# Patient Record
Sex: Male | Born: 1935 | Race: White | Hispanic: No | Smoking: Never smoker
Health system: Southern US, Community
[De-identification: ages and names within clinical notes are randomized; demographics above are authoritative.]

## PROBLEM LIST (undated history)

## (undated) DIAGNOSIS — N3941 Urge incontinence: Secondary | ICD-10-CM

## (undated) DIAGNOSIS — I10 Essential (primary) hypertension: Secondary | ICD-10-CM

## (undated) DIAGNOSIS — M5430 Sciatica, unspecified side: Secondary | ICD-10-CM

## (undated) DIAGNOSIS — F0390 Unspecified dementia without behavioral disturbance: Secondary | ICD-10-CM

## (undated) DIAGNOSIS — N400 Enlarged prostate without lower urinary tract symptoms: Secondary | ICD-10-CM

## (undated) DIAGNOSIS — I2510 Atherosclerotic heart disease of native coronary artery without angina pectoris: Secondary | ICD-10-CM

## (undated) DIAGNOSIS — N368 Other specified disorders of urethra: Secondary | ICD-10-CM

## (undated) DIAGNOSIS — F419 Anxiety disorder, unspecified: Secondary | ICD-10-CM

## (undated) DIAGNOSIS — M1990 Unspecified osteoarthritis, unspecified site: Secondary | ICD-10-CM

## (undated) DIAGNOSIS — N390 Urinary tract infection, site not specified: Secondary | ICD-10-CM

## (undated) DIAGNOSIS — N200 Calculus of kidney: Secondary | ICD-10-CM

## (undated) DIAGNOSIS — R350 Frequency of micturition: Secondary | ICD-10-CM

## (undated) DIAGNOSIS — I4891 Unspecified atrial fibrillation: Secondary | ICD-10-CM

## (undated) DIAGNOSIS — K219 Gastro-esophageal reflux disease without esophagitis: Secondary | ICD-10-CM

## (undated) DIAGNOSIS — R3129 Other microscopic hematuria: Secondary | ICD-10-CM

## (undated) DIAGNOSIS — E785 Hyperlipidemia, unspecified: Secondary | ICD-10-CM

## (undated) DIAGNOSIS — M47816 Spondylosis without myelopathy or radiculopathy, lumbar region: Secondary | ICD-10-CM

## (undated) HISTORY — PX: BACK SURGERY: SHX140

## (undated) HISTORY — DX: Other microscopic hematuria: R31.29

## (undated) HISTORY — PX: PROSTATE BIOPSY: SHX241

## (undated) HISTORY — DX: Anxiety disorder, unspecified: F41.9

## (undated) HISTORY — DX: Sciatica, unspecified side: M54.30

## (undated) HISTORY — PX: CARDIAC SURGERY: SHX584

## (undated) HISTORY — DX: Atherosclerotic heart disease of native coronary artery without angina pectoris: I25.10

## (undated) HISTORY — DX: Spondylosis without myelopathy or radiculopathy, lumbar region: M47.816

## (undated) HISTORY — DX: Frequency of micturition: R35.0

## (undated) HISTORY — DX: Calculus of kidney: N20.0

## (undated) HISTORY — DX: Other specified disorders of urethra: N36.8

## (undated) HISTORY — DX: Urge incontinence: N39.41

## (undated) HISTORY — DX: Hyperlipidemia, unspecified: E78.5

## (undated) HISTORY — PX: CORONARY ARTERY BYPASS GRAFT: SHX141

## (undated) HISTORY — DX: Benign prostatic hyperplasia without lower urinary tract symptoms: N40.0

---

## 2000-05-28 ENCOUNTER — Emergency Department (HOSPITAL_COMMUNITY): Admission: EM | Admit: 2000-05-28 | Discharge: 2000-05-28 | Payer: Self-pay | Admitting: Emergency Medicine

## 2000-08-13 ENCOUNTER — Encounter: Admission: RE | Admit: 2000-08-13 | Discharge: 2000-08-13 | Payer: Self-pay | Admitting: Internal Medicine

## 2000-08-13 ENCOUNTER — Encounter: Payer: Self-pay | Admitting: Internal Medicine

## 2000-12-02 ENCOUNTER — Ambulatory Visit (HOSPITAL_COMMUNITY): Admission: RE | Admit: 2000-12-02 | Discharge: 2000-12-02 | Payer: Self-pay | Admitting: Gastroenterology

## 2001-09-25 ENCOUNTER — Encounter: Payer: Self-pay | Admitting: Internal Medicine

## 2001-09-25 ENCOUNTER — Inpatient Hospital Stay (HOSPITAL_COMMUNITY): Admission: AD | Admit: 2001-09-25 | Discharge: 2001-09-26 | Payer: Self-pay | Admitting: Internal Medicine

## 2001-11-26 ENCOUNTER — Inpatient Hospital Stay (HOSPITAL_COMMUNITY): Admission: EM | Admit: 2001-11-26 | Discharge: 2001-11-28 | Payer: Self-pay | Admitting: *Deleted

## 2003-01-18 ENCOUNTER — Encounter: Admission: RE | Admit: 2003-01-18 | Discharge: 2003-01-18 | Payer: Self-pay | Admitting: Urology

## 2003-01-19 ENCOUNTER — Ambulatory Visit (HOSPITAL_BASED_OUTPATIENT_CLINIC_OR_DEPARTMENT_OTHER): Admission: RE | Admit: 2003-01-19 | Discharge: 2003-01-19 | Payer: Self-pay | Admitting: Urology

## 2003-01-19 ENCOUNTER — Ambulatory Visit (HOSPITAL_COMMUNITY): Admission: RE | Admit: 2003-01-19 | Discharge: 2003-01-19 | Payer: Self-pay | Admitting: Urology

## 2003-01-26 ENCOUNTER — Emergency Department (HOSPITAL_COMMUNITY): Admission: EM | Admit: 2003-01-26 | Discharge: 2003-01-26 | Payer: Self-pay | Admitting: Emergency Medicine

## 2003-01-28 ENCOUNTER — Emergency Department (HOSPITAL_COMMUNITY): Admission: EM | Admit: 2003-01-28 | Discharge: 2003-01-28 | Payer: Self-pay | Admitting: Emergency Medicine

## 2003-01-29 ENCOUNTER — Emergency Department (HOSPITAL_COMMUNITY): Admission: EM | Admit: 2003-01-29 | Discharge: 2003-01-29 | Payer: Self-pay | Admitting: Emergency Medicine

## 2003-05-10 ENCOUNTER — Inpatient Hospital Stay (HOSPITAL_COMMUNITY): Admission: AD | Admit: 2003-05-10 | Discharge: 2003-05-14 | Payer: Self-pay | Admitting: Internal Medicine

## 2003-05-28 ENCOUNTER — Inpatient Hospital Stay (HOSPITAL_COMMUNITY): Admission: RE | Admit: 2003-05-28 | Discharge: 2003-05-30 | Payer: Self-pay | Admitting: Neurosurgery

## 2003-05-28 ENCOUNTER — Encounter (INDEPENDENT_AMBULATORY_CARE_PROVIDER_SITE_OTHER): Payer: Self-pay | Admitting: *Deleted

## 2005-03-15 ENCOUNTER — Encounter: Admission: RE | Admit: 2005-03-15 | Discharge: 2005-03-15 | Payer: Self-pay | Admitting: Cardiology

## 2005-03-17 ENCOUNTER — Inpatient Hospital Stay (HOSPITAL_COMMUNITY): Admission: RE | Admit: 2005-03-17 | Discharge: 2005-03-24 | Payer: Self-pay | Admitting: Cardiology

## 2005-03-28 ENCOUNTER — Emergency Department (HOSPITAL_COMMUNITY): Admission: EM | Admit: 2005-03-28 | Discharge: 2005-03-28 | Payer: Self-pay | Admitting: Emergency Medicine

## 2005-04-16 ENCOUNTER — Encounter: Admission: RE | Admit: 2005-04-16 | Discharge: 2005-04-16 | Payer: Self-pay | Admitting: Cardiology

## 2006-01-20 ENCOUNTER — Emergency Department (HOSPITAL_COMMUNITY): Admission: EM | Admit: 2006-01-20 | Discharge: 2006-01-20 | Payer: Self-pay | Admitting: Emergency Medicine

## 2007-10-17 IMAGING — CR DG CHEST 1V PORT
1 series · 1 of 1 positions shown · non-contrast
Comparison: 03/20/05.

CLINICAL DATA: 79-year-old.  Bypass surgery. 
 PORTABLE CHEST ? 1 VIEW ? 03/21/05:

[view not recorded]
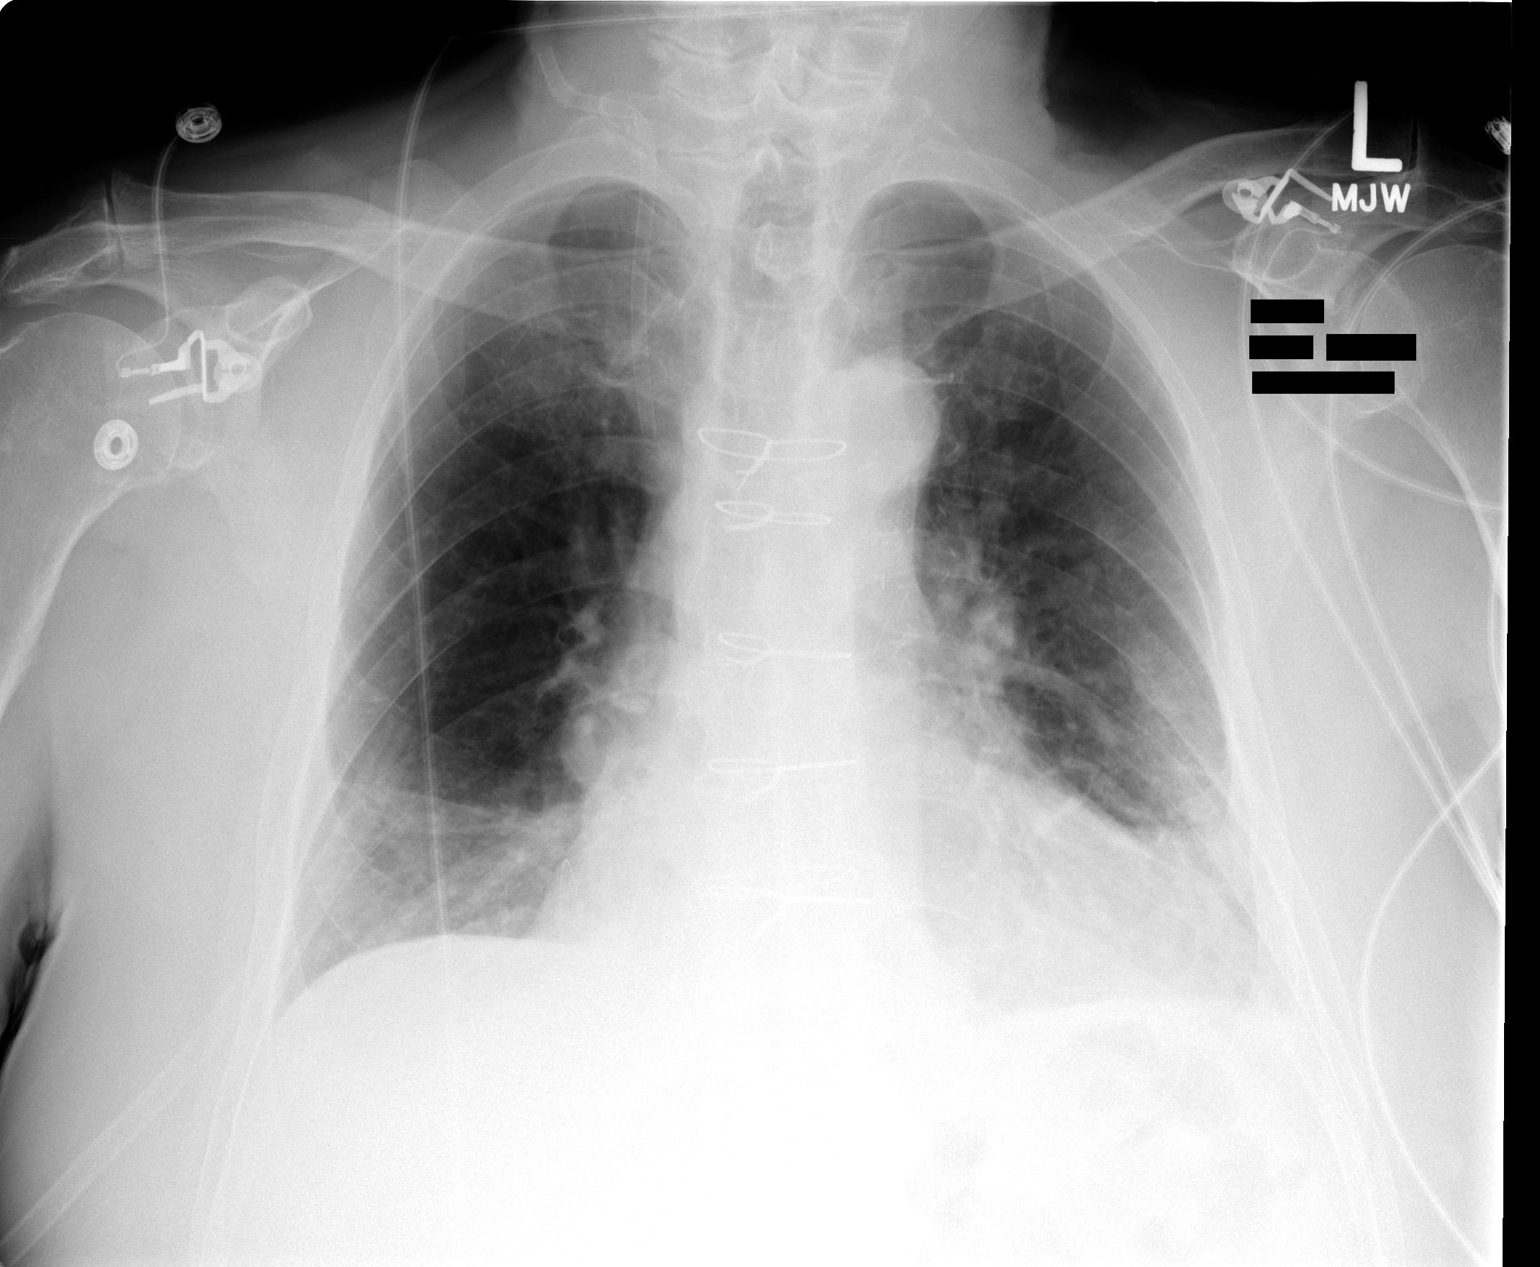

[1 of 1 positions shown; findings below may reference images not displayed]

FINDINGS: The Swan-Ganz catheter has been removed.  The right IJ Cordis is still in place.  Left-sided chest tube has been removed.  No definite pneumothorax is seen on the left.  The mediastinal drain tubes have been removed.  There is persistent streaky basilar atelectasis and a small left effusion.  No pulmonary edema.
IMPRESSION: 1.  Removal of support apparatus as discussed above.  No pneumothorax is seen.
 2.  Persistent streaky bibasilar atelectasis and a small left effusion. 
 3.  No edema.

## 2007-10-18 IMAGING — CR DG CHEST 2V
2 series · 2 of 2 positions shown · non-contrast
Comparison: 03/21/2005

CLINICAL DATA: Chest pain, CABG

CHEST - 2 VIEW:

[w chest pa]
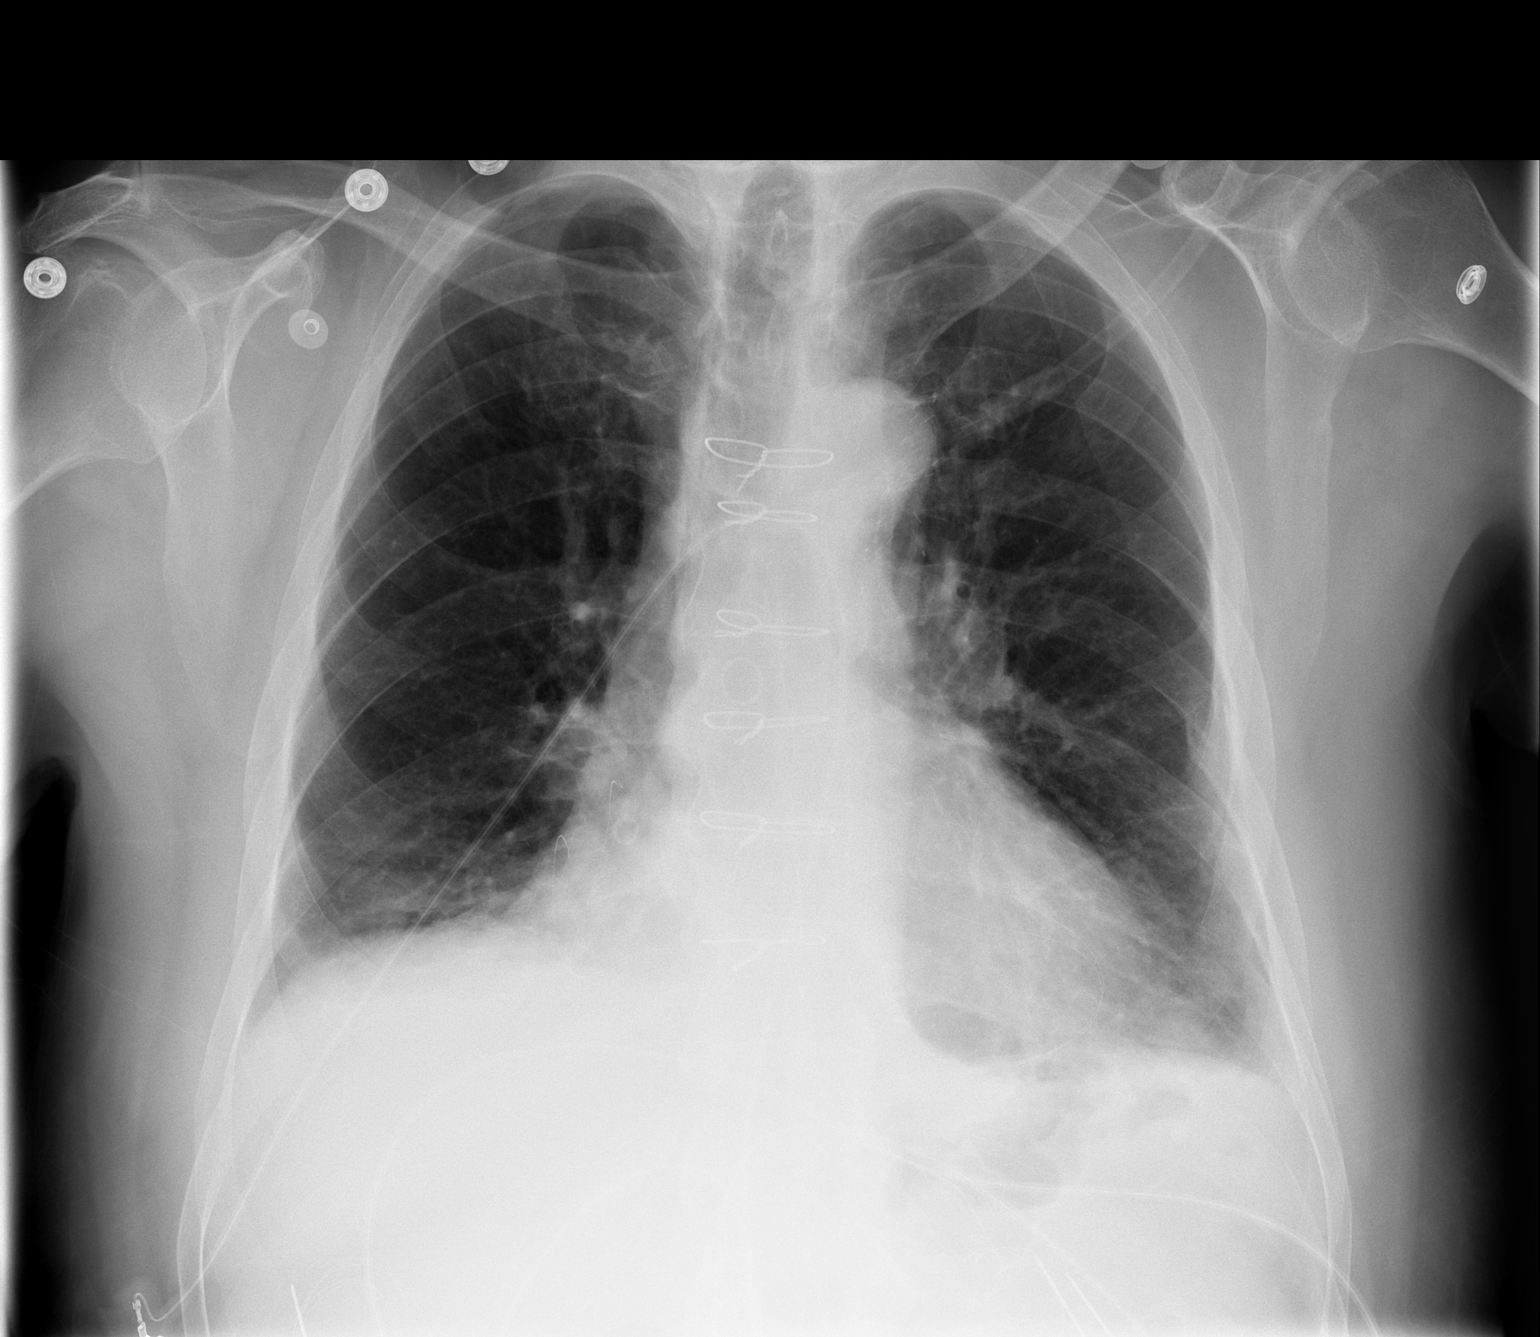

[w chest lat]
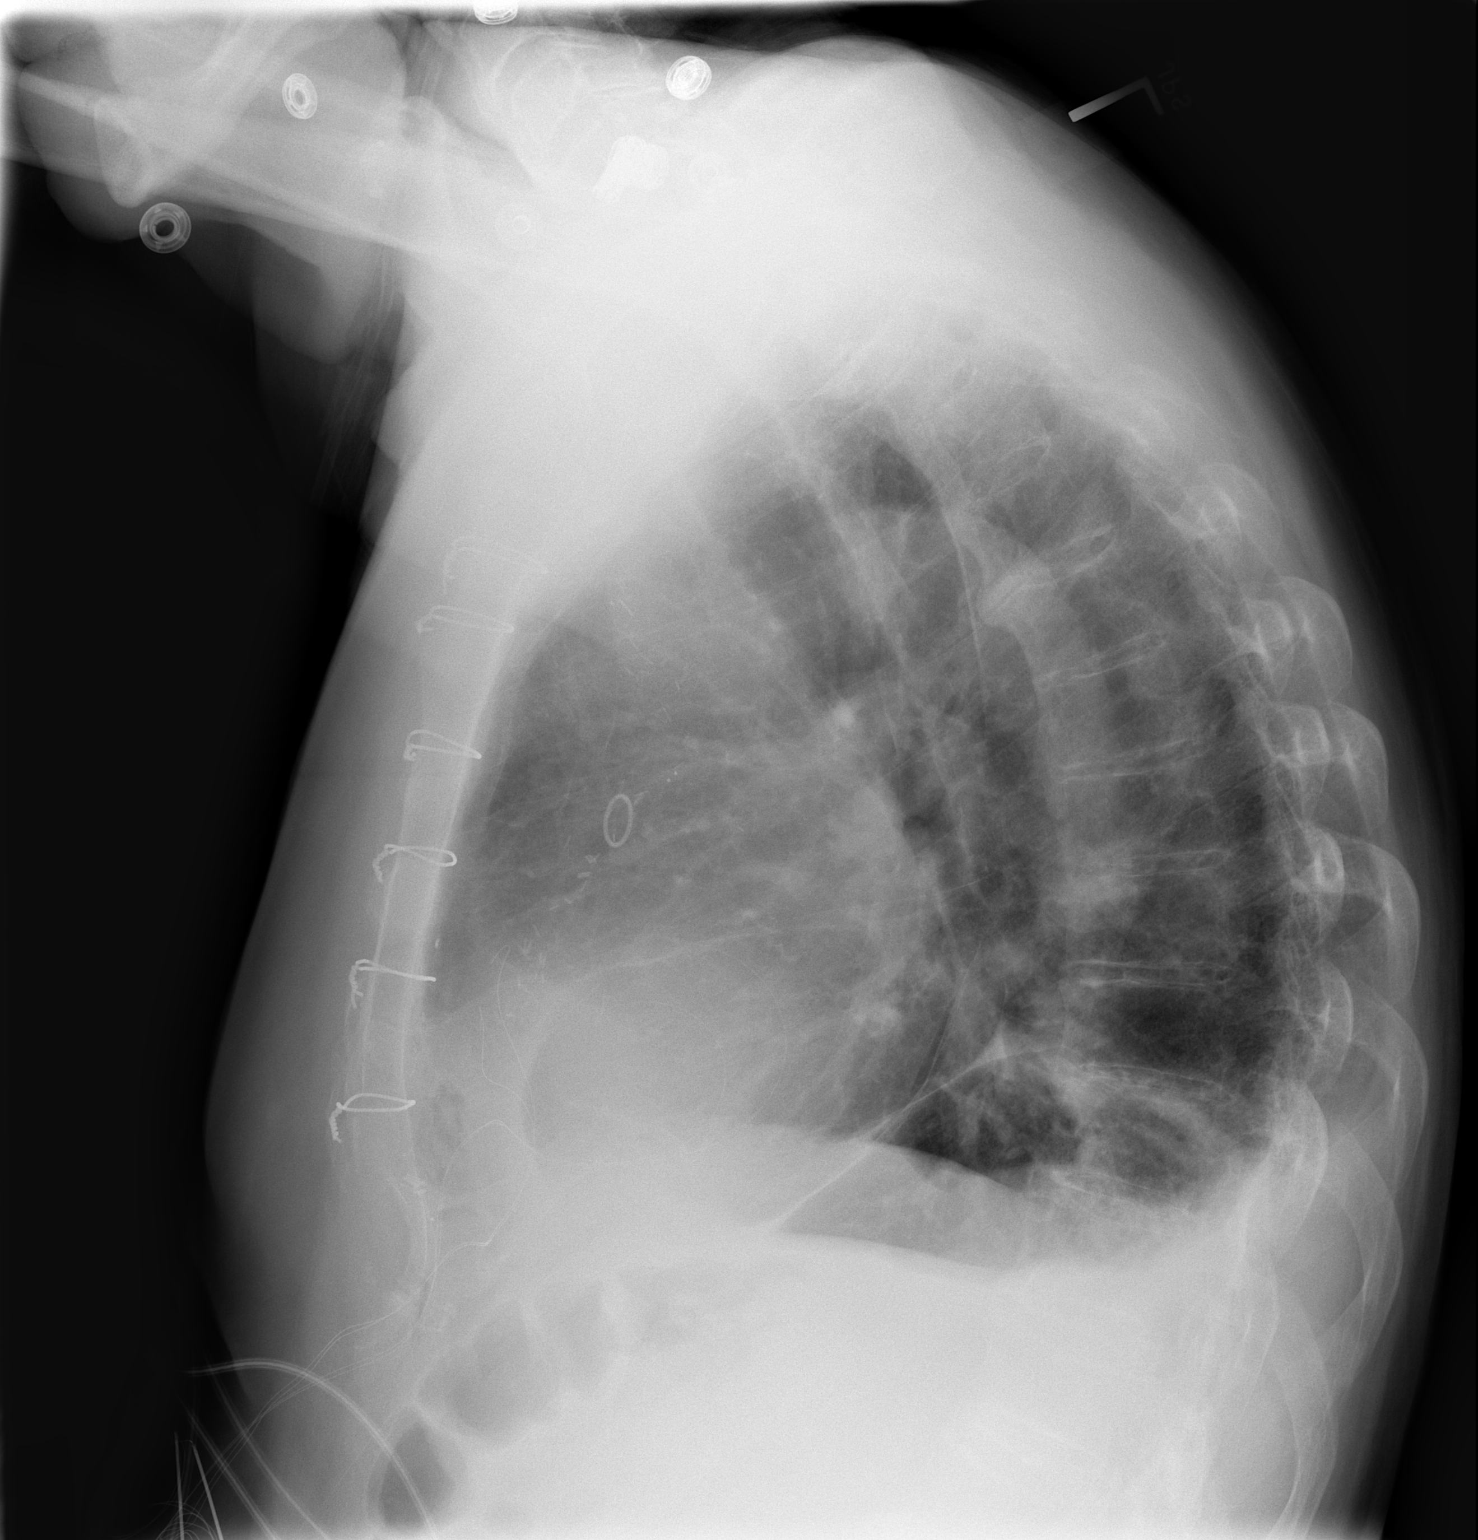

[2 of 2 positions shown; findings below may reference images not displayed]

FINDINGS: Postoperative changes. There is bibasilar atelectasis, slightly
improved since prior study. Small bilateral effusions. Mild cardiomegaly.
IMPRESSION: Decreasing bibasilar atelectasis. Small effusions.

## 2008-04-20 ENCOUNTER — Observation Stay (HOSPITAL_COMMUNITY): Admission: EM | Admit: 2008-04-20 | Discharge: 2008-04-21 | Payer: Self-pay | Admitting: Emergency Medicine

## 2008-09-30 ENCOUNTER — Ambulatory Visit: Payer: Self-pay | Admitting: Vascular Surgery

## 2008-09-30 ENCOUNTER — Inpatient Hospital Stay (HOSPITAL_COMMUNITY): Admission: EM | Admit: 2008-09-30 | Discharge: 2008-10-08 | Payer: Self-pay | Admitting: Emergency Medicine

## 2008-10-01 ENCOUNTER — Encounter (INDEPENDENT_AMBULATORY_CARE_PROVIDER_SITE_OTHER): Payer: Self-pay | Admitting: Internal Medicine

## 2008-10-06 ENCOUNTER — Ambulatory Visit: Payer: Self-pay | Admitting: Physical Medicine & Rehabilitation

## 2008-12-07 ENCOUNTER — Emergency Department (HOSPITAL_COMMUNITY): Admission: EM | Admit: 2008-12-07 | Discharge: 2008-12-07 | Payer: Self-pay | Admitting: Emergency Medicine

## 2008-12-10 ENCOUNTER — Inpatient Hospital Stay (HOSPITAL_COMMUNITY): Admission: EM | Admit: 2008-12-10 | Discharge: 2008-12-14 | Payer: Self-pay | Admitting: Emergency Medicine

## 2008-12-10 ENCOUNTER — Encounter (INDEPENDENT_AMBULATORY_CARE_PROVIDER_SITE_OTHER): Payer: Self-pay | Admitting: Internal Medicine

## 2008-12-10 ENCOUNTER — Ambulatory Visit: Payer: Self-pay | Admitting: Internal Medicine

## 2009-09-19 ENCOUNTER — Inpatient Hospital Stay (HOSPITAL_COMMUNITY): Admission: EM | Admit: 2009-09-19 | Discharge: 2009-09-22 | Payer: Self-pay | Admitting: Emergency Medicine

## 2009-09-21 ENCOUNTER — Encounter (INDEPENDENT_AMBULATORY_CARE_PROVIDER_SITE_OTHER): Payer: Self-pay | Admitting: Internal Medicine

## 2009-09-21 ENCOUNTER — Ambulatory Visit: Payer: Self-pay | Admitting: Vascular Surgery

## 2009-09-22 ENCOUNTER — Ambulatory Visit: Payer: Self-pay | Admitting: Hematology & Oncology

## 2009-09-30 ENCOUNTER — Ambulatory Visit: Payer: Self-pay | Admitting: Diagnostic Radiology

## 2009-09-30 ENCOUNTER — Ambulatory Visit (HOSPITAL_BASED_OUTPATIENT_CLINIC_OR_DEPARTMENT_OTHER): Admission: RE | Admit: 2009-09-30 | Discharge: 2009-09-30 | Payer: Self-pay | Admitting: Hematology & Oncology

## 2009-09-30 LAB — CBC WITH DIFFERENTIAL (CANCER CENTER ONLY)
BASO%: 0.5 % (ref 0.0–2.0)
HCT: 41.6 % (ref 38.7–49.9)
MONO#: 0.5 10*3/uL (ref 0.1–0.9)
MONO%: 7 % (ref 0.0–13.0)
NEUT#: 5.3 10*3/uL (ref 1.5–6.5)
Platelets: 278 10*3/uL (ref 145–400)
RBC: 4.66 10*6/uL (ref 4.20–5.70)
RDW: 13.2 % (ref 10.5–14.6)

## 2009-10-05 LAB — KAPPA/LAMBDA LIGHT CHAINS: Kappa:Lambda Ratio: 0.77 (ref 0.26–1.65)

## 2009-10-05 LAB — COMPREHENSIVE METABOLIC PANEL
ALT: 11 U/L (ref 0–53)
Albumin: 3.8 g/dL (ref 3.5–5.2)
Alkaline Phosphatase: 73 U/L (ref 39–117)
BUN: 14 mg/dL (ref 6–23)
CO2: 26 mEq/L (ref 19–32)
Calcium: 9.2 mg/dL (ref 8.4–10.5)
Chloride: 107 mEq/L (ref 96–112)
Creatinine, Ser: 1.18 mg/dL (ref 0.40–1.50)
Potassium: 3.5 mEq/L (ref 3.5–5.3)

## 2009-10-05 LAB — SPEP & IFE WITH QIG
Alpha-1-Globulin: 6.8 % — ABNORMAL HIGH (ref 2.9–4.9)
Alpha-2-Globulin: 10.4 % (ref 7.1–11.8)
Beta Globulin: 7.5 % — ABNORMAL HIGH (ref 4.7–7.2)
Gamma Globulin: 19.2 % — ABNORMAL HIGH (ref 11.1–18.8)
IgG (Immunoglobin G), Serum: 1420 mg/dL (ref 694–1618)
Total Protein, Serum Electrophoresis: 6.9 g/dL (ref 6.0–8.3)

## 2009-10-07 LAB — UIFE/LIGHT CHAINS/TP QN, 24-HR UR
Free Kappa/Lambda Ratio: 1.2 ratio (ref 0.46–4.00)
Free Lt Chn Excr Rate: 32.13 mg/d
Time: 24 hours
Total Protein, Urine: 6.4 mg/dL
Volume, Urine: 1190 mL

## 2009-11-20 ENCOUNTER — Inpatient Hospital Stay (HOSPITAL_COMMUNITY): Admission: EM | Admit: 2009-11-20 | Discharge: 2009-12-02 | Payer: Self-pay | Admitting: Emergency Medicine

## 2010-02-15 ENCOUNTER — Ambulatory Visit: Payer: Self-pay | Admitting: Hematology & Oncology

## 2010-04-11 LAB — HEPATIC FUNCTION PANEL
AST: 148 U/L — ABNORMAL HIGH (ref 0–37)
Albumin: 1.9 g/dL — ABNORMAL LOW (ref 3.5–5.2)
Alkaline Phosphatase: 127 U/L — ABNORMAL HIGH (ref 39–117)
Bilirubin, Direct: 0.2 mg/dL (ref 0.0–0.3)
Indirect Bilirubin: 0.6 mg/dL (ref 0.3–0.9)
Total Bilirubin: 0.8 mg/dL (ref 0.3–1.2)
Total Protein: 5.9 g/dL — ABNORMAL LOW (ref 6.0–8.3)

## 2010-04-11 LAB — BASIC METABOLIC PANEL
BUN: 21 mg/dL (ref 6–23)
CO2: 25 mEq/L (ref 19–32)
CO2: 28 mEq/L (ref 19–32)
Calcium: 8 mg/dL — ABNORMAL LOW (ref 8.4–10.5)
Calcium: 8.3 mg/dL — ABNORMAL LOW (ref 8.4–10.5)
Chloride: 111 mEq/L (ref 96–112)
Creatinine, Ser: 0.85 mg/dL (ref 0.4–1.5)
Creatinine, Ser: 0.88 mg/dL (ref 0.4–1.5)
GFR calc Af Amer: >60 mL/min (ref >60)
GFR calc Af Amer: >60 mL/min (ref >60)
GFR calc non Af Amer: >60 mL/min (ref >60)
Glucose, Bld: 155 mg/dL — ABNORMAL HIGH (ref 70–99)
Potassium: 3.5 mEq/L (ref 3.5–5.1)
Sodium: 141 mEq/L (ref 135–145)

## 2010-04-11 LAB — CBC
MCH: 28.6 pg (ref 26.0–34.0)
MCH: 29.1 pg (ref 26.0–34.0)
Platelets: 362 10*3/uL (ref 150–400)
Platelets: 365 10*3/uL (ref 150–400)
RBC: 3.61 MIL/uL — ABNORMAL LOW (ref 4.22–5.81)
RBC: 3.78 MIL/uL — ABNORMAL LOW (ref 4.22–5.81)
RBC: 3.95 MIL/uL — ABNORMAL LOW (ref 4.22–5.81)
WBC: 20.6 10*3/uL — ABNORMAL HIGH (ref 4.0–10.5)
WBC: 22.2 10*3/uL — ABNORMAL HIGH (ref 4.0–10.5)

## 2010-04-11 LAB — COMPREHENSIVE METABOLIC PANEL
AST: 71 U/L — ABNORMAL HIGH (ref 0–37)
Albumin: 1.9 g/dL — ABNORMAL LOW (ref 3.5–5.2)
Calcium: 8 mg/dL — ABNORMAL LOW (ref 8.4–10.5)
Chloride: 106 mEq/L (ref 96–112)
Creatinine, Ser: 0.88 mg/dL (ref 0.4–1.5)
GFR calc Af Amer: >60 mL/min (ref >60)

## 2010-04-11 LAB — GLUCOSE, CAPILLARY
Glucose-Capillary: 123 mg/dL — ABNORMAL HIGH (ref 70–99)
Glucose-Capillary: 127 mg/dL — ABNORMAL HIGH (ref 70–99)
Glucose-Capillary: 134 mg/dL — ABNORMAL HIGH (ref 70–99)
Glucose-Capillary: 136 mg/dL — ABNORMAL HIGH (ref 70–99)
Glucose-Capillary: 147 mg/dL — ABNORMAL HIGH (ref 70–99)
Glucose-Capillary: 151 mg/dL — ABNORMAL HIGH (ref 70–99)
Glucose-Capillary: 152 mg/dL — ABNORMAL HIGH (ref 70–99)
Glucose-Capillary: 173 mg/dL — ABNORMAL HIGH (ref 70–99)
Glucose-Capillary: 246 mg/dL — ABNORMAL HIGH (ref 70–99)

## 2010-04-12 LAB — DIFFERENTIAL
Basophils Absolute: 0 10*3/uL (ref 0.0–0.1)
Basophils Absolute: 0 10*3/uL (ref 0.0–0.1)
Basophils Absolute: 0.1 10*3/uL (ref 0.0–0.1)
Basophils Relative: 0 % (ref 0–1)
Basophils Relative: 0 % (ref 0–1)
Eosinophils Absolute: 0 10*3/uL (ref 0.0–0.7)
Eosinophils Absolute: 0.1 10*3/uL (ref 0.0–0.7)
Eosinophils Relative: 0 % (ref 0–5)
Eosinophils Relative: 1 % (ref 0–5)
Lymphocytes Relative: 13 % (ref 12–46)
Lymphocytes Relative: 5 % — ABNORMAL LOW (ref 12–46)
Lymphocytes Relative: 6 % — ABNORMAL LOW (ref 12–46)
Lymphs Abs: 1.9 10*3/uL (ref 0.7–4.0)
Monocytes Absolute: 1.2 10*3/uL — ABNORMAL HIGH (ref 0.1–1.0)
Monocytes Absolute: 1.5 10*3/uL — ABNORMAL HIGH (ref 0.1–1.0)
Monocytes Absolute: 1.6 10*3/uL — ABNORMAL HIGH (ref 0.1–1.0)
Monocytes Relative: 8 % (ref 3–12)
Neutro Abs: 11.4 10*3/uL — ABNORMAL HIGH (ref 1.7–7.7)
Neutro Abs: 18 10*3/uL — ABNORMAL HIGH (ref 1.7–7.7)
Neutrophils Relative %: 78 % — ABNORMAL HIGH (ref 43–77)
Neutrophils Relative %: 85 % — ABNORMAL HIGH (ref 43–77)

## 2010-04-12 LAB — COMPREHENSIVE METABOLIC PANEL
ALT: 21 U/L (ref 0–53)
ALT: 47 U/L (ref 0–53)
AST: 173 U/L — ABNORMAL HIGH (ref 0–37)
AST: 29 U/L (ref 0–37)
AST: 33 U/L (ref 0–37)
AST: 78 U/L — ABNORMAL HIGH (ref 0–37)
Albumin: 1.8 g/dL — ABNORMAL LOW (ref 3.5–5.2)
Albumin: 1.9 g/dL — ABNORMAL LOW (ref 3.5–5.2)
Albumin: 2.1 g/dL — ABNORMAL LOW (ref 3.5–5.2)
Albumin: 3.2 g/dL — ABNORMAL LOW (ref 3.5–5.2)
Alkaline Phosphatase: 140 U/L — ABNORMAL HIGH (ref 39–117)
BUN: 24 mg/dL — ABNORMAL HIGH (ref 6–23)
CO2: 25 mEq/L (ref 19–32)
CO2: 27 mEq/L (ref 19–32)
Calcium: 9.2 mg/dL (ref 8.4–10.5)
Chloride: 100 mEq/L (ref 96–112)
Chloride: 104 mEq/L (ref 96–112)
Chloride: 104 mEq/L (ref 96–112)
Chloride: 110 mEq/L (ref 96–112)
Creatinine, Ser: 0.98 mg/dL (ref 0.4–1.5)
Creatinine, Ser: 1.15 mg/dL (ref 0.4–1.5)
Creatinine, Ser: 1.58 mg/dL — ABNORMAL HIGH (ref 0.4–1.5)
Creatinine, Ser: 1.69 mg/dL — ABNORMAL HIGH (ref 0.4–1.5)
GFR calc Af Amer: 47 mL/min — ABNORMAL LOW (ref >60)
GFR calc Af Amer: 51 mL/min — ABNORMAL LOW (ref >60)
GFR calc Af Amer: >60 mL/min (ref >60)
GFR calc Af Amer: >60 mL/min (ref >60)
GFR calc non Af Amer: 42 mL/min — ABNORMAL LOW (ref >60)
GFR calc non Af Amer: 53 mL/min — ABNORMAL LOW (ref >60)
GFR calc non Af Amer: >60 mL/min (ref >60)
Glucose, Bld: 157 mg/dL — ABNORMAL HIGH (ref 70–99)
Potassium: 2.8 mEq/L — ABNORMAL LOW (ref 3.5–5.1)
Potassium: 3.3 mEq/L — ABNORMAL LOW (ref 3.5–5.1)
Sodium: 134 mEq/L — ABNORMAL LOW (ref 135–145)
Sodium: 135 mEq/L (ref 135–145)
Total Bilirubin: 0.7 mg/dL (ref 0.3–1.2)
Total Bilirubin: 1 mg/dL (ref 0.3–1.2)
Total Bilirubin: 1.1 mg/dL (ref 0.3–1.2)
Total Bilirubin: 1.1 mg/dL (ref 0.3–1.2)
Total Protein: 5.5 g/dL — ABNORMAL LOW (ref 6.0–8.3)

## 2010-04-12 LAB — GLUCOSE, CAPILLARY
Glucose-Capillary: 123 mg/dL — ABNORMAL HIGH (ref 70–99)
Glucose-Capillary: 128 mg/dL — ABNORMAL HIGH (ref 70–99)
Glucose-Capillary: 131 mg/dL — ABNORMAL HIGH (ref 70–99)
Glucose-Capillary: 140 mg/dL — ABNORMAL HIGH (ref 70–99)
Glucose-Capillary: 141 mg/dL — ABNORMAL HIGH (ref 70–99)
Glucose-Capillary: 143 mg/dL — ABNORMAL HIGH (ref 70–99)
Glucose-Capillary: 144 mg/dL — ABNORMAL HIGH (ref 70–99)
Glucose-Capillary: 144 mg/dL — ABNORMAL HIGH (ref 70–99)
Glucose-Capillary: 156 mg/dL — ABNORMAL HIGH (ref 70–99)
Glucose-Capillary: 167 mg/dL — ABNORMAL HIGH (ref 70–99)
Glucose-Capillary: 190 mg/dL — ABNORMAL HIGH (ref 70–99)
Glucose-Capillary: 200 mg/dL — ABNORMAL HIGH (ref 70–99)
Glucose-Capillary: 200 mg/dL — ABNORMAL HIGH (ref 70–99)
Glucose-Capillary: 203 mg/dL — ABNORMAL HIGH (ref 70–99)
Glucose-Capillary: 204 mg/dL — ABNORMAL HIGH (ref 70–99)
Glucose-Capillary: 220 mg/dL — ABNORMAL HIGH (ref 70–99)
Glucose-Capillary: 220 mg/dL — ABNORMAL HIGH (ref 70–99)
Glucose-Capillary: 226 mg/dL — ABNORMAL HIGH (ref 70–99)
Glucose-Capillary: 230 mg/dL — ABNORMAL HIGH (ref 70–99)

## 2010-04-12 LAB — HEPATIC FUNCTION PANEL
ALT: 20 U/L (ref 0–53)
AST: 138 U/L — ABNORMAL HIGH (ref 0–37)
AST: 36 U/L (ref 0–37)
Albumin: 1.9 g/dL — ABNORMAL LOW (ref 3.5–5.2)
Alkaline Phosphatase: 113 U/L (ref 39–117)
Alkaline Phosphatase: 66 U/L (ref 39–117)
Total Bilirubin: 1.4 mg/dL — ABNORMAL HIGH (ref 0.3–1.2)
Total Protein: 5.7 g/dL — ABNORMAL LOW (ref 6.0–8.3)
Total Protein: 5.8 g/dL — ABNORMAL LOW (ref 6.0–8.3)

## 2010-04-12 LAB — CBC
HCT: 34.7 % — ABNORMAL LOW (ref 39.0–52.0)
Hemoglobin: 11.7 g/dL — ABNORMAL LOW (ref 13.0–17.0)
Hemoglobin: 11.9 g/dL — ABNORMAL LOW (ref 13.0–17.0)
Hemoglobin: 11.9 g/dL — ABNORMAL LOW (ref 13.0–17.0)
Hemoglobin: 13.2 g/dL (ref 13.0–17.0)
Hemoglobin: 13.9 g/dL (ref 13.0–17.0)
Hemoglobin: 15.3 g/dL (ref 13.0–17.0)
MCH: 28.8 pg (ref 26.0–34.0)
MCH: 29.1 pg (ref 26.0–34.0)
MCH: 29.2 pg (ref 26.0–34.0)
MCH: 29.4 pg (ref 26.0–34.0)
MCH: 29.8 pg (ref 26.0–34.0)
MCHC: 33.3 g/dL (ref 30.0–36.0)
MCHC: 33.4 g/dL (ref 30.0–36.0)
MCHC: 34.1 g/dL (ref 30.0–36.0)
MCV: 87.3 fL (ref 78.0–100.0)
Platelets: 118 10*3/uL — ABNORMAL LOW (ref 150–400)
Platelets: 121 10*3/uL — ABNORMAL LOW (ref 150–400)
Platelets: 135 10*3/uL — ABNORMAL LOW (ref 150–400)
Platelets: 178 10*3/uL (ref 150–400)
RBC: 4.01 MIL/uL — ABNORMAL LOW (ref 4.22–5.81)
RBC: 4.04 MIL/uL — ABNORMAL LOW (ref 4.22–5.81)
RBC: 4.06 MIL/uL — ABNORMAL LOW (ref 4.22–5.81)
RBC: 4.09 MIL/uL — ABNORMAL LOW (ref 4.22–5.81)
RBC: 4.58 MIL/uL (ref 4.22–5.81)
RBC: 4.72 MIL/uL (ref 4.22–5.81)
RDW: 13.9 % (ref 11.5–15.5)
WBC: 14 10*3/uL — ABNORMAL HIGH (ref 4.0–10.5)
WBC: 19.1 10*3/uL — ABNORMAL HIGH (ref 4.0–10.5)
WBC: 19.5 10*3/uL — ABNORMAL HIGH (ref 4.0–10.5)
WBC: 20.2 10*3/uL — ABNORMAL HIGH (ref 4.0–10.5)

## 2010-04-12 LAB — URINE MICROSCOPIC-ADD ON

## 2010-04-12 LAB — URINE CULTURE
Colony Count: 3000
Culture  Setup Time: 201110231204

## 2010-04-12 LAB — LIPID PANEL
Cholesterol: 70 mg/dL (ref 0–200)
HDL: 44 mg/dL (ref >39)
Total CHOL/HDL Ratio: 1.6 RATIO

## 2010-04-12 LAB — URINALYSIS, ROUTINE W REFLEX MICROSCOPIC
Bilirubin Urine: NEGATIVE
Glucose, UA: NEGATIVE mg/dL
Ketones, ur: NEGATIVE mg/dL
Leukocytes, UA: NEGATIVE
Nitrite: NEGATIVE
Protein, ur: NEGATIVE mg/dL
Specific Gravity, Urine: 1.018 (ref 1.005–1.030)
Urobilinogen, UA: 0.2 mg/dL (ref 0.0–1.0)
pH: 5 (ref 5.0–8.0)

## 2010-04-12 LAB — MAGNESIUM: Magnesium: 2.3 mg/dL (ref 1.5–2.5)

## 2010-04-12 LAB — BASIC METABOLIC PANEL
BUN: 15 mg/dL (ref 6–23)
CO2: 22 mEq/L (ref 19–32)
CO2: 23 mEq/L (ref 19–32)
Calcium: 8.2 mg/dL — ABNORMAL LOW (ref 8.4–10.5)
Calcium: 8.7 mg/dL (ref 8.4–10.5)
Chloride: 108 mEq/L (ref 96–112)
Creatinine, Ser: 1.51 mg/dL — ABNORMAL HIGH (ref 0.4–1.5)
GFR calc Af Amer: 54 mL/min — ABNORMAL LOW (ref >60)
GFR calc Af Amer: >60 mL/min (ref >60)
GFR calc non Af Amer: 42 mL/min — ABNORMAL LOW (ref >60)
GFR calc non Af Amer: >60 mL/min (ref >60)
Glucose, Bld: 176 mg/dL — ABNORMAL HIGH (ref 70–99)
Potassium: 3.8 mEq/L (ref 3.5–5.1)
Sodium: 135 mEq/L (ref 135–145)
Sodium: 135 mEq/L (ref 135–145)

## 2010-04-12 LAB — PREALBUMIN: Prealbumin: 6.4 mg/dL — ABNORMAL LOW (ref 18.0–45.0)

## 2010-04-12 LAB — CHOLESTEROL, TOTAL
Cholesterol: 47 mg/dL (ref 0–200)
Cholesterol: 55 mg/dL (ref 0–200)

## 2010-04-12 LAB — LIPASE, BLOOD
Lipase: 133 U/L — ABNORMAL HIGH (ref 11–59)
Lipase: 564 U/L — ABNORMAL HIGH (ref 11–59)

## 2010-04-12 LAB — CULTURE, BLOOD (ROUTINE X 2)
Culture  Setup Time: 201110231846
Culture: NO GROWTH

## 2010-04-12 LAB — PHOSPHORUS
Phosphorus: 1.7 mg/dL — ABNORMAL LOW (ref 2.3–4.6)
Phosphorus: 2.2 mg/dL — ABNORMAL LOW (ref 2.3–4.6)

## 2010-04-12 LAB — TRIGLYCERIDES: Triglycerides: 63 mg/dL (ref <150)

## 2010-04-12 LAB — AMYLASE: Amylase: 324 U/L — ABNORMAL HIGH (ref 0–105)

## 2010-04-13 LAB — COMPREHENSIVE METABOLIC PANEL
ALT: 10 U/L (ref 0–53)
AST: 17 U/L (ref 0–37)
CO2: 30 mEq/L (ref 19–32)
Chloride: 109 mEq/L (ref 96–112)
Creatinine, Ser: 1.15 mg/dL (ref 0.4–1.5)
GFR calc Af Amer: >60 mL/min (ref >60)
GFR calc non Af Amer: >60 mL/min (ref >60)
Glucose, Bld: 117 mg/dL — ABNORMAL HIGH (ref 70–99)
Sodium: 143 mEq/L (ref 135–145)
Total Bilirubin: 0.5 mg/dL (ref 0.3–1.2)

## 2010-04-13 LAB — DIFFERENTIAL
Basophils Relative: 0 % (ref 0–1)
Eosinophils Absolute: 0.2 10*3/uL (ref 0.0–0.7)
Eosinophils Relative: 2 % (ref 0–5)
Lymphs Abs: 2.4 10*3/uL (ref 0.7–4.0)
Monocytes Absolute: 0.9 10*3/uL (ref 0.1–1.0)
Monocytes Relative: 10 % (ref 3–12)

## 2010-04-13 LAB — UIFE/LIGHT CHAINS/TP QN, 24-HR UR
Free Lambda Lt Chains,Ur: 4.61 mg/dL — ABNORMAL HIGH (ref 0.08–1.01)
Total Protein, Urine: 19.6 mg/dL

## 2010-04-13 LAB — PROTEIN ELECTROPHORESIS, SERUM
Alpha-1-Globulin: 5 % — ABNORMAL HIGH (ref 2.9–4.9)
Alpha-2-Globulin: 11.1 % (ref 7.1–11.8)
Gamma Globulin: 18.4 % (ref 11.1–18.8)
Total Protein ELP: 6.3 g/dL (ref 6.0–8.3)

## 2010-04-13 LAB — BASIC METABOLIC PANEL
BUN: 12 mg/dL (ref 6–23)
CO2: 27 mEq/L (ref 19–32)
Calcium: 8.8 mg/dL (ref 8.4–10.5)
Calcium: 8.9 mg/dL (ref 8.4–10.5)
Chloride: 108 mEq/L (ref 96–112)
Creatinine, Ser: 1.14 mg/dL (ref 0.4–1.5)
GFR calc Af Amer: >60 mL/min (ref >60)
GFR calc non Af Amer: >60 mL/min (ref >60)
Glucose, Bld: 105 mg/dL — ABNORMAL HIGH (ref 70–99)
Glucose, Bld: 180 mg/dL — ABNORMAL HIGH (ref 70–99)
Sodium: 142 mEq/L (ref 135–145)

## 2010-04-13 LAB — URINALYSIS, ROUTINE W REFLEX MICROSCOPIC
Glucose, UA: NEGATIVE mg/dL
Ketones, ur: NEGATIVE mg/dL
Protein, ur: NEGATIVE mg/dL
Urobilinogen, UA: 0.2 mg/dL (ref 0.0–1.0)

## 2010-04-13 LAB — CBC
HCT: 36.7 % — ABNORMAL LOW (ref 39.0–52.0)
HCT: 37.8 % — ABNORMAL LOW (ref 39.0–52.0)
HCT: 42.6 % (ref 39.0–52.0)
Hemoglobin: 12.3 g/dL — ABNORMAL LOW (ref 13.0–17.0)
Hemoglobin: 12.6 g/dL — ABNORMAL LOW (ref 13.0–17.0)
MCH: 30.1 pg (ref 26.0–34.0)
MCH: 30.3 pg (ref 26.0–34.0)
MCHC: 33.4 g/dL (ref 30.0–36.0)
MCHC: 33.8 g/dL (ref 30.0–36.0)
MCV: 90.1 fL (ref 78.0–100.0)
Platelets: 251 10*3/uL (ref 150–400)
RBC: 4.06 MIL/uL — ABNORMAL LOW (ref 4.22–5.81)
RBC: 4.19 MIL/uL — ABNORMAL LOW (ref 4.22–5.81)
RDW: 14.4 % (ref 11.5–15.5)

## 2010-04-13 LAB — LIPID PANEL: VLDL: 25 mg/dL (ref 0–40)

## 2010-04-13 LAB — TSH: TSH: 2.729 u[IU]/mL (ref 0.350–4.500)

## 2010-04-13 LAB — MAGNESIUM: Magnesium: 2.1 mg/dL (ref 1.5–2.5)

## 2010-04-13 LAB — POCT CARDIAC MARKERS: Myoglobin, poc: 273 ng/mL (ref 12–200)

## 2010-04-13 LAB — URINE MICROSCOPIC-ADD ON

## 2010-05-03 LAB — COMPREHENSIVE METABOLIC PANEL
AST: 19 U/L (ref 0–37)
Albumin: 3.5 g/dL (ref 3.5–5.2)
BUN: 11 mg/dL (ref 6–23)
BUN: 9 mg/dL (ref 6–23)
CO2: 26 mEq/L (ref 19–32)
Calcium: 8.2 mg/dL — ABNORMAL LOW (ref 8.4–10.5)
Calcium: 9 mg/dL (ref 8.4–10.5)
Chloride: 103 mEq/L (ref 96–112)
Creatinine, Ser: 1.11 mg/dL (ref 0.4–1.5)
Creatinine, Ser: 1.13 mg/dL (ref 0.4–1.5)
GFR calc Af Amer: >60 mL/min (ref >60)
GFR calc non Af Amer: >60 mL/min (ref >60)
Glucose, Bld: 106 mg/dL — ABNORMAL HIGH (ref 70–99)
Total Bilirubin: 1 mg/dL (ref 0.3–1.2)
Total Protein: 6.4 g/dL (ref 6.0–8.3)

## 2010-05-03 LAB — CBC
HCT: 33.8 % — ABNORMAL LOW (ref 39.0–52.0)
HCT: 42 % (ref 39.0–52.0)
Hemoglobin: 11.9 g/dL — ABNORMAL LOW (ref 13.0–17.0)
Hemoglobin: 13 g/dL (ref 13.0–17.0)
Hemoglobin: 13.8 g/dL (ref 13.0–17.0)
MCHC: 33.9 g/dL (ref 30.0–36.0)
MCHC: 35.3 g/dL (ref 30.0–36.0)
MCV: 90.8 fL (ref 78.0–100.0)
MCV: 90.9 fL (ref 78.0–100.0)
Platelets: 202 10*3/uL (ref 150–400)
RBC: 3.75 MIL/uL — ABNORMAL LOW (ref 4.22–5.81)
RBC: 4.21 MIL/uL — ABNORMAL LOW (ref 4.22–5.81)
RDW: 13.1 % (ref 11.5–15.5)
RDW: 13.2 % (ref 11.5–15.5)
WBC: 11.4 10*3/uL — ABNORMAL HIGH (ref 4.0–10.5)

## 2010-05-03 LAB — BASIC METABOLIC PANEL
CO2: 25 mEq/L (ref 19–32)
CO2: 27 mEq/L (ref 19–32)
Calcium: 8.7 mg/dL (ref 8.4–10.5)
Calcium: 8.3 mg/dL — ABNORMAL LOW (ref 8.4–10.5)
Chloride: 112 mEq/L (ref 96–112)
Creatinine, Ser: 1.06 mg/dL (ref 0.4–1.5)
GFR calc Af Amer: >60 mL/min (ref >60)
GFR calc Af Amer: >60 mL/min (ref >60)
Potassium: 3.3 mEq/L — ABNORMAL LOW (ref 3.5–5.1)
Sodium: 143 mEq/L (ref 135–145)

## 2010-05-03 LAB — LACTIC ACID, PLASMA: Lactic Acid, Venous: 1.3 mmol/L (ref 0.5–2.2)

## 2010-05-03 LAB — POCT I-STAT, CHEM 8
Calcium, Ion: 1.14 mmol/L (ref 1.12–1.32)
Glucose, Bld: 118 mg/dL — ABNORMAL HIGH (ref 70–99)
HCT: 44 % (ref 39.0–52.0)
Hemoglobin: 15 g/dL (ref 13.0–17.0)
Potassium: 3.4 mEq/L — ABNORMAL LOW (ref 3.5–5.1)

## 2010-05-03 LAB — DIFFERENTIAL
Basophils Absolute: 0.1 10*3/uL (ref 0.0–0.1)
Eosinophils Absolute: 0.3 10*3/uL (ref 0.0–0.7)
Eosinophils Relative: 1 % (ref 0–5)
Lymphocytes Relative: 11 % — ABNORMAL LOW (ref 12–46)
Lymphocytes Relative: 5 % — ABNORMAL LOW (ref 12–46)
Lymphs Abs: 0.6 10*3/uL — ABNORMAL LOW (ref 0.7–4.0)
Lymphs Abs: 1 10*3/uL (ref 0.7–4.0)
Monocytes Absolute: 0.7 10*3/uL (ref 0.1–1.0)
Monocytes Relative: 8 % (ref 3–12)
Neutro Abs: 7.5 10*3/uL (ref 1.7–7.7)
Neutro Abs: 9.9 10*3/uL — ABNORMAL HIGH (ref 1.7–7.7)
Neutrophils Relative %: 78 % — ABNORMAL HIGH (ref 43–77)

## 2010-05-03 LAB — URINALYSIS, ROUTINE W REFLEX MICROSCOPIC
Bilirubin Urine: NEGATIVE
Ketones, ur: 40 mg/dL — AB
Ketones, ur: NEGATIVE mg/dL
Nitrite: NEGATIVE
Nitrite: NEGATIVE
Protein, ur: 30 mg/dL — AB
Specific Gravity, Urine: 1.015 (ref 1.005–1.030)
Urobilinogen, UA: 0.2 mg/dL (ref 0.0–1.0)

## 2010-05-03 LAB — CULTURE, BLOOD (ROUTINE X 2): Culture: NO GROWTH

## 2010-05-03 LAB — URINE CULTURE
Colony Count: 3000
Colony Count: >=100000

## 2010-05-05 LAB — BASIC METABOLIC PANEL
BUN: 11 mg/dL (ref 6–23)
BUN: 12 mg/dL (ref 6–23)
BUN: 15 mg/dL (ref 6–23)
CO2: 31 mEq/L (ref 19–32)
Calcium: 8.9 mg/dL (ref 8.4–10.5)
Chloride: 112 mEq/L (ref 96–112)
Creatinine, Ser: 1.05 mg/dL (ref 0.4–1.5)
GFR calc Af Amer: >60 mL/min (ref >60)
GFR calc non Af Amer: >60 mL/min (ref >60)
Glucose, Bld: 101 mg/dL — ABNORMAL HIGH (ref 70–99)
Glucose, Bld: 162 mg/dL — ABNORMAL HIGH (ref 70–99)
Potassium: 3.1 mEq/L — ABNORMAL LOW (ref 3.5–5.1)
Potassium: 4.1 mEq/L (ref 3.5–5.1)
Sodium: 142 mEq/L (ref 135–145)

## 2010-05-05 LAB — CBC
HCT: 39 % (ref 39.0–52.0)
MCHC: 33.7 g/dL (ref 30.0–36.0)
MCHC: 33.7 g/dL (ref 30.0–36.0)
MCV: 90.2 fL (ref 78.0–100.0)
Platelets: 242 10*3/uL (ref 150–400)
RBC: 4.27 MIL/uL (ref 4.22–5.81)
RDW: 14 % (ref 11.5–15.5)

## 2010-05-05 LAB — URINE MICROSCOPIC-ADD ON

## 2010-05-05 LAB — URINE CULTURE

## 2010-05-05 LAB — LIPID PANEL
HDL: 38 mg/dL — ABNORMAL LOW (ref >39)
VLDL: 7 mg/dL (ref 0–40)

## 2010-05-05 LAB — URINALYSIS, ROUTINE W REFLEX MICROSCOPIC
Bilirubin Urine: NEGATIVE
Glucose, UA: NEGATIVE mg/dL
Protein, ur: 30 mg/dL — AB

## 2010-05-05 LAB — POCT CARDIAC MARKERS

## 2010-05-05 LAB — DIFFERENTIAL
Basophils Absolute: 0 10*3/uL (ref 0.0–0.1)
Eosinophils Absolute: 0.2 10*3/uL (ref 0.0–0.7)
Eosinophils Relative: 2 % (ref 0–5)

## 2010-05-05 LAB — CARDIAC PANEL(CRET KIN+CKTOT+MB+TROPI)
Relative Index: 1.7 (ref 0.0–2.5)
Relative Index: 1.7 (ref 0.0–2.5)
Total CK: 129 U/L (ref 7–232)
Troponin I: 0.02 ng/mL (ref 0.00–0.06)
Troponin I: 0.03 ng/mL (ref 0.00–0.06)
Troponin I: 0.03 ng/mL (ref 0.00–0.06)

## 2010-05-05 LAB — TSH: TSH: 1.042 u[IU]/mL (ref 0.350–4.500)

## 2010-05-11 LAB — URINALYSIS, ROUTINE W REFLEX MICROSCOPIC
Bilirubin Urine: NEGATIVE
Ketones, ur: NEGATIVE mg/dL
Nitrite: NEGATIVE
Specific Gravity, Urine: 1.011 (ref 1.005–1.030)
Urobilinogen, UA: 0.2 mg/dL (ref 0.0–1.0)

## 2010-05-11 LAB — DIFFERENTIAL
Basophils Relative: 0 % (ref 0–1)
Eosinophils Absolute: 0 10*3/uL (ref 0.0–0.7)
Neutrophils Relative %: 91 % — ABNORMAL HIGH (ref 43–77)

## 2010-05-11 LAB — COMPREHENSIVE METABOLIC PANEL
ALT: 19 U/L (ref 0–53)
Alkaline Phosphatase: 74 U/L (ref 39–117)
CO2: 24 mEq/L (ref 19–32)
Chloride: 105 mEq/L (ref 96–112)
GFR calc non Af Amer: 49 mL/min — ABNORMAL LOW (ref >60)
Glucose, Bld: 116 mg/dL — ABNORMAL HIGH (ref 70–99)
Potassium: 3.8 mEq/L (ref 3.5–5.1)
Sodium: 141 mEq/L (ref 135–145)

## 2010-05-11 LAB — URINE CULTURE: Culture: NO GROWTH

## 2010-05-11 LAB — CBC
Hemoglobin: 14 g/dL (ref 13.0–17.0)
RBC: 4.53 MIL/uL (ref 4.22–5.81)

## 2010-05-11 LAB — URINE MICROSCOPIC-ADD ON

## 2010-06-13 NOTE — H&P (Signed)
NAME:  Clifford Spencer, Clifford Spencer NO.:  [id number]   MEDICAL RECORD NO.:  [id number]          PATIENT TYPE:  EMS   LOCATION:  ED                           FACILITY:  [facility]   PHYSICIAN:  Hollice Espy, M.D.DATE OF BIRTH:  06-Sep-1925   DATE OF ADMISSION:  04/20/2008  DATE OF DISCHARGE:                              HISTORY & PHYSICAL   ATTENDING PHYSICIAN:  Hollice Espy, M.D.   The patient's PCP is Dr. Johnella Moloney.   CHIEF COMPLAINT:  Cough and rigors.   HISTORY OF PRESENT ILLNESS:  The patient is an 77 year old white male  with a past medical history of hyperlipidemia and BPH who had been doing  relatively well, not really having any shortness of breath or URI  complaints and then last night when he went to bed he started coughing  heavily and felt quite dyspneic.  He initially associated it with a new  anxiety medicine that his PCP had put him on but he could not get over  his cough and shortness of breath, so he came into the emergency room.  In the emergency room he was noted to have a white count of only 10.3  but with a 91% shift.  His urinalysis was negative and a chest x-ray  showed some signs of COPD with a questionable left lower lobe  infiltrate.  He was noted also to have a temperature of 100.5.  After  receiving some IV fluids and antibiotics, his pressure dropped from 102  down to 80.  His heart rate dropped from 102 down to 87 and his pressure  stabilized at 123/69.  Currently the patient is doing better.  He denies  any headaches, vision changes or dysphagia.  No chest pain,  palpitations.  He says his breathing is a little bit easier.  He denies  any wheezing.  He does complain of a mild cough but not really able to  produce any sputum.  No abdominal pain.  No hematuria, dysuria,  constipation, diarrhea, numbness weakness or pain.  Review of systems  otherwise negative.   The patient's past medical history includes CAD, BPH, hyperlipidemia,  GERD, and anxiety.   MEDICATIONS:  He is on Flomax, aspirin, Lipitor, Protonix, Klonopin, and  VESIcare.  Klonopin was just started the day prior.   HE HAS NO KNOWN DRUG ALLERGIES.   SOCIAL HISTORY:  Denies any tobacco, alcohol or drug use.  He used to be  an alcoholic but quit over 50 years ago.   FAMILY HISTORY:  Noncontributory.   PHYSICAL EXAMINATION:  VITALS:  On admission temperature 97.6, heart  rate 102 now down to 87, blood pressure 155/84 now down to 122/69,  respirations 24 now down to 18.  He had a T-max of 100.5.  GENERAL:  He is alert and oriented x3, in no apparent distress.  HEENT:  Normocephalic, atraumatic.  Mucous membranes are moist.  He had  no carotid bruits.  HEART:  Regular rate and rhythm, S1, S2.  LUNGS:  Some decreased breath sounds at the left base.  ABDOMEN:  Soft, nontender, nondistended.  Positive bowel sounds.  EXTREMITIES:  Show no clubbing, cyanosis or edema.   LAB WORK:  Chest x-ray is as per HPI.  White count 10.3, H and H 14 and  41.  MCV 91, platelet count 173, 91% shift.  Sodium 141, potassium 3.8,  chloride 105, bicarb 24, BUN 22, creatinine 1.4, glucose 116.  LFTs  noted for an albumin of 3.4.  Urinalysis unremarkable.   ASSESSMENT:  1. Pneumonia.  2. History of hyperlipidemia.  3. History of benign prostatic hypertrophy.  4. Coronary artery disease.   PLAN:  IV antibiotics, p.r.n. nebulizers, gentle hydration.  Will keep  in his Foley catheter and observe for 24 hours, possibly could be  discharged home tomorrow.      Hollice Espy, M.D.  Electronically Signed     SKK/MEDQ  D:  04/20/2008  T:  04/20/2008  Job:  [id number]   cc:   Candyce Churn, M.D.  Fax: [phone]

## 2010-06-13 NOTE — Discharge Summary (Signed)
NAME:  Clifford Spencer, Clifford Spencer NO.:  [id number]   MEDICAL RECORD NO.:  [id number]          PATIENT TYPE:  OBV   LOCATION:  1507                         FACILITY:  [facility]   PHYSICIAN:  Candyce Churn, M.D.DATE OF BIRTH:  Oct 16, 1925   DATE OF ADMISSION:  04/20/2008  DATE OF DISCHARGE:  04/21/2008                               DISCHARGE SUMMARY   DISCHARGE DIAGNOSES:  1. Febrile illness, possibly early left lower lobe pneumonia,      symptomatically improved.  2. Benign prostatic hypertrophy.  3. Hyperlipidemia.  4. Anxiety/depression.  5. Gastroesophageal reflux disease.  6. Urinary urgency.   DISCHARGE MEDICATIONS:  1. Flomax 0.4 mg p.o. nightly.  2. Aspirin 81 mg daily.  3. Lipitor 10 mg daily.  4. Protonix 40 mg daily.  5. Klonopin 0.5 mg p.o. nightly.  6. VESIcare 5 mg daily.  7. Azithromycin 250 mg daily for 5 days.   HOSPITAL COURSE:  Mr. Clifford Spencer is a very pleasant 77 year old male  who was just admitted yesterday after developing cough and low grade  fever which he was possibly attributing to Klonopin 0.5 mg which he had  just started that evening of the day before yesterday.  He had been seen  in the office the day before and had not been feeling that well and had  been feeling anxious.  I suspect that this respiratory illness may have  been starting prior to receiving the Klonopin.   He presented to the [facility] Emergency Room and had a white count of  10,300 with a 91% shift and chest x-ray showed signs of COPD and a  questionable left lower lobe infiltrate.  Temperature was 100.5.  He  received IV fluids and antibiotics, but his pressure dropped from 102  down to 80 systolically and with IV fluid hydration his blood pressure  stabilized in the 120s over 60s with pulse of 87.  Over the last 24  hours he has done very well and is eating well and vital signs are  stable, afebrile.  He is on IV Avelox which will switch to azithromycin  250 mg  daily for 5 days as an outpatient.   I want him to continue on his Klonopin and medications as per discharge  above and I will plan to see him in the office in 48 hours.   CONDITION ON DISCHARGE:  Improved.   DISCHARGE LABORATORIES:  From day of admission, CBC:  White count  10,300, hemoglobin 14, platelet count 173,000 with 91% neutrophils on  differential.  Urinalysis was within normal limits with a pH of 7, urine  microscopic revealed 3 to 6 red cells.  Complete metabolic profile  revealed a sodium 141, potassium 3.8, chloride 105, bicarb 24, glucose  116, BUN 22, creatinine 1.39, total bili 0.9, alk phos 74, AST 27, ALT  19, total protein 6.4, albumin 3.4, calcium 8.9.   I will plan to see him back in my office in 48 hours on April 22, 2008  and he will notify me if he is not feeling well prior to that.  Candyce Churn, M.D.  Electronically Signed     RNG/MEDQ  D:  04/21/2008  T:  04/21/2008  Job:  [id number]

## 2010-06-13 NOTE — H&P (Signed)
NAME:  Clifford Spencer, Clifford Spencer NO.:  [id number]   MEDICAL RECORD NO.:  [id number]          PATIENT TYPE:  INP   LOCATION:  0107                         FACILITY:  [facility]   PHYSICIAN:  Conley Canal, MD      DATE OF BIRTH:  10/20/1925   DATE OF ADMISSION:  09/30/2008  DATE OF DISCHARGE:                              HISTORY & PHYSICAL   Toya Smothers, nurse practitioner for [facility] dictating on  behalf of Dr. Venetia Constable.   PRIMARY CARE Landrey Mahurin:  Candyce Churn, M.D.   CHIEF COMPLAINT:  Vertigo, nausea and vomiting.   HISTORY OF PRESENT ILLNESS:  Clifford Spencer is a very pleasant 77 year old  male with a history of CAD, hyperlipidemia, BPH, who felt fine upon  rising this morning.  He went for his customary two mile walk and about  halfway through the walk he states everything began to spin.  He states  his walk became unsteady and two joggers assisted him to the ground.  He  then vomited.  He denies chest pain, shortness of breath, palpitations,  numbness or tingling.  He did not become confused or lose consciousness.  He did not lose control of bowel or bladder.  He remained nauseated and  911 was called.  In the ED he received Zofran, Antivert and Valium.  Admission CT was negative for acute hemorrhage.  We are asked to admit  for further evaluation and treatment.   ALLERGIES:  No known drug allergies.   PAST MEDICAL HISTORY:  1. BPH.  2. Hyperlipidemia.  3. Anxiety, depression.  4. GERD.  5. Urinary urgency.  6. History of left lower lobe pneumonia.  7. CAD.   PAST SURGICAL HISTORY:  1. CABG in 2007.  2. Back surgery.   FAMILY HISTORY:  Noncontributory to this admission.   SOCIAL HISTORY:  Negative for tobacco.  Negative for alcohol for the  last 50 years.   MEDICATIONS:  1. Flomax 0.4 mg p.o. nightly.  2. ASA 81 mg p.o. daily.  3. Lipitor 10 mg p.o. daily.  4. Protonix 40 mg p.o. daily.  5. Klonopin 0.5 mg daily.   REVIEW OF SYSTEMS:   GENERAL:  Negative fever, chills, weight loss.  ENT:  Negative sore throat, negative nasal congestion.  CV:  Negative chest  pain, negative palpitations, negative lower extremity edema.  RESPIRATORY:  Negative shortness of breath.  Positive for a dry cough.  NEURO:  Negative headache, negative visual disturbances, negative  numbness and tingling.  GI:  Negative constipation, negative diarrhea,  negative melena.  GU:  Negative dysuria, negative frequency, positive  urgency.  Negative hematuria.  MUSCULOSKELETAL:  Negative muscle  weakness, negative joint pain.  PSYCH:  Negative depression, anxiety.  SKIN:  Negative rash and lesions.   LABORATORIES/RADIOLOGY:  Sodium 143, potassium 3.1, chloride 112, CO2  equals 25, BUN 15, creatinine 1.12, glucose 162.  WBC 9.8, hemoglobin  13.1, hematocrit 39.0, platelets 242.  Myoglobin 107, CK-MB less than  1.0, troponin less than 0.05.  UA showed many bacteria, moderate blood,  positive protein, negative nitrite and leukocyte.  EKG:  Sinus brady at  55.  CT of the head:  Negative acute hemorrhage, nonspecific white  matter disease.  Chest x-ray:  Moderate enlargement of cardiac  silhouette, slightly larger than prior study done in March of this year.  Slight increase of right hemidiaphragm with minimal basilar atelectasis.  No pulmonary edema, consolidation or pleural effusion.  MRI of the  brain:  No acute intracranial abnormality, chronic small vessel  ischemia.   PHYSICAL EXAMINATION:  T:  97.4, BP:  15678, HR:  51, R:  20 and 99% on  2 liters.  GENERAL:  Sitting up in bed.  Alert and oriented, smiling.  HEAD:  Normocephalic, atraumatic.  NECK:  Supple.  No JVD.  Full range of motion.  CV:  RRR.  No lower extremity edema.  RESPIRATORY:  No increased work of breathing.  Slightly coarse  posterior, otherwise clear bilaterally.  NEURO:  Grip 4/5 bilateral.  Lower extremities strength:  5/5.  Cranial  nerves II-XII intact.  Sensation intact.   SKIN:  No rashes or lesions.  Warm and dry.  MUSCULOSKELETAL:  Moves all extremities.  No joint swelling or erythema.   ASSESSMENT/PLAN:  1. Vertigo, etiology unclear.  We will admit to telemetry.  We will      cycle cardiac enzymes, two-dimensional echocardiogram, carotid      Doppler.  We will check [facility] and evaluate orthostatics.  2. Hypokalemia.  Potassium 3.1.  We will replete.  Check BMET in the      morning.  3. Hyperlipidemia.  We will check fasting lipid profile.  Continue      Lipitor.  4. Anxiety/depression.  Currently stable.  We will continue home      medications.  5. Gastroesophageal reflux disease, currently stable.  We will      continue home medications.  6. Benign prostatic hypertrophy.  We will discontinue Flomax for now      secondary to side effects and per protocol.  7. Coronary artery disease.  We will place on telemetry.  Continue      ASA.  8. Lovenox for deep vein thrombosis prophylaxis.  This assessment and plan was discussed with Dr. Venetia Constable.     ______________________________  Toya Smothers, NP      Conley Canal, MD  Electronically Signed    KB/MEDQ  D:  09/30/2008  T:  09/30/2008  Job:  [id number]   cc:   Candyce Churn, M.D.  Fax: [phone]

## 2010-06-16 NOTE — H&P (Signed)
NAME:  Clifford Spencer, HECKENDORN NO.:  [id number]   MEDICAL RECORD NO.:  [id number]                   PATIENT TYPE:  INP   LOCATION:  5725                                 FACILITY:  [facility]   PHYSICIAN:  Candyce Churn, MD            DATE OF BIRTH:  May 15, 1925   DATE OF ADMISSION:  11/26/2001  DATE OF DISCHARGE:                                HISTORY & PHYSICAL   CHIEF COMPLAINT:  Nausea, vomiting, abdominal pain, dysuria, and possible  renal calculus.   HISTORY OF PRESENT ILLNESS:  The patient is a very, very pleasant 76-year-  old male with history of:  1. BPH.  2. Recurrent UTI in August 2003 and October 2003 felt to be secondary to     bladder outlet obstruction from BPH.  He has had acute urinary retention     in August 2003 requiring a Foley catheter.  3. Erectile dysfunction.  4. Episodic sciatica.  5. Universal diverticulosis noted on colonoscopy in 11/2000 per Dr. Danise Edge.  6. History of prostatitis in 08/2001 at the time of acute urinary retention.   The patient presents with 36 to 48 hours of nausea and vomiting and unable  to keep any liquids down.  He has a leukocytosis and evidence on CT scanning  to suggest a recently pass right renal calculus in the bladder.  Urinalysis  suggests urinary tract infection, and there was mild right hydronephrosis  and question of some urothelial neoplasia in the lower pole of the right  kidney.  He is admitted now for IV fluids secondary to persistent nausea and  vomiting and treatment of a urinary tract infection.   PAST MEDICAL HISTORY:  As above.  BPH is followed by Dr. Ihor Gully.  He  has passed a renal calculus in the past as well.   INJURIES::  1. Left ankle fracture in 1980.  2. Two broken ribs and a broken left wrist in fall from a ladder years ago.   PAST SURGICAL HISTORY:  Cystoscopy for Dr. Ihor Gully in the past.   HEALTH MAINTENANCE:  Pneumovax in 1999, flu vaccine yearly.   Colonoscopy  12/02/2001 revealing universal colon diverticulosis, most extensive on the  left as above.  Upper GI series July 2002 to evaluate dyspepsia.  This  revealed GERD with hiatal hernia.   FAMILY HISTORY:  Noncontributory.   SOCIAL HISTORY:  No smoking or alcohol.  Married to Scarlette Calico which is a  second marriage.  His first wife died of cancer.  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   REVIEW OF SYSTEMS:  Negative for headaches, shortness of breath, or chest  pain.  Positive for right abdominal pain which is better actually over the  last several hours.  He does have dysuria and urinary frequency and  complains of decreased urinary stream.  He has had some chills  as well.  No  recent sciatica.   PHYSICAL EXAMINATION:  GENERAL:  Well-nourished male who is no longer  vomiting.  He feels weak and washed out.  VITAL SIGNS:  Blood pressure 180/101, pulse 77 and regular, respiratory rate  18 and easy.  He is afebrile.  HEENT:  Atraumatic, normocephalic.  He wears glasses.  NECK:  Without JVD.  Obvious thyroid or goiter.  Neck is supple.  CHEST:  Clear to auscultation.  CARDIAC:  Regular rate and rhythm without murmur or gallop.  ABDOMEN:  Soft, nontender without distention.  Bowel sounds are normal.  No  rebound.  Right flank pain is better according to the patient.  EXTREMITIES:  Without cyanosis, clubbing, or edema.  RECTAL:  Exam reveals an enlarged prostate which is nontender and non-  nodular.   LABORATORY DATA:  CT of the abdomen revealed:  1. Mild right hydronephrosis,  2. Small stone and debris in the bladder.  It appears as though he may have     recently passed a renal stone.  3. A 7 to 8 mm filling defect in the right kidney, question of a     uroepithelial neoplasm.   Chest x-ray reveals chronic interstitial lung disease with bibasilar  atelectasis.  No change from chest x-ray of 09/25/2001.   EKG:  Sinus rhythm at 80, occasional PVC,  otherwise normal EKG.   White count 18,100, hemoglobin 14.1, platelet count 211,000.  Differential  reveals 84% leukocytes.   Sodium136, potassium 3.2, chloride 103, bicarb 26, BUN 19, creatinine 1.4,  glucose 148.  PT 13.6 seconds, PTT 30 seconds.  Urinalysis reveals specific  gravity 1.820 with large blood.  Leukocytes are small.  Protein is 100  mg/dl, ketones 15 mg/dl, white cells 11 to 25, red cells 7 to 20 per high  power field, bacteria few in number.  LFTs are normal.  Amylase and lipase  are normal at 79 and 26, respectively.   ASSESSMENT:  A 78 year old male with nausea, vomiting, and evidence of a  urinary tract infection with leukocytosis.  He also may have passed a right  renal stone.  CT scan reveals mild right hydronephrosis.  He does have a  history of benign prostatic hypertrophy and urinary retention.   PLAN:  1. Treat with IV antibiotic therapy and IV fluids.  2. GU consult with Dr. Ihor Gully.  3. Will hopefully discharge in the next 24 to 48 hours.   If he is continuing to take Tylenol PM, this needs to be discontinued  because of the anticholinergenic side effects from Benadryl.  The patient  may be a candidate for Proscar and/or TURP.                                               Candyce Churn, MD    RNG/MEDQ  D:  11/26/2001  T:  11/26/2001  Job:  [id number]   cc:   Veverly Fells. Vernie Ammons, M.D.

## 2010-06-16 NOTE — H&P (Signed)
NAME:  Clifford Spencer, Clifford Spencer                          ACCOUNT NO.:  [id number]   MEDICAL RECORD NO.:  [id number]                   PATIENT TYPE:  INP   LOCATION:  5731                                 FACILITY:  [facility]   PHYSICIAN:  Barbette Or, M.D.               DATE OF BIRTH:  21-Jul-1925   DATE OF ADMISSION:  09/25/2001  DATE OF DISCHARGE:  09/26/2001                                HISTORY & PHYSICAL   CHIEF COMPLAINT:  Fever, nausea, vomiting, chills, and weakness.   HISTORY OF PRESENT ILLNESS:  The patient is a very pleasant 77 year old male  with a history of BPH, erectile dysfunction, episodic sciatica. universal  colonic diverticulosis.  The patient presents with low back for  approximately 36 to 48 hours.  He was seen in our office at [facility] yesterday and was prescribed a codeine preparation with  Tylenol for low back pain.  Today, developed fever, chills, nausea, and  vomiting.  Presented to our office significantly prostate.  Unable to hold  liquids down.   Prostatic exam was done and he was prostate was found to be tender.  Abdominal exam was benign.  The patient is now admitted for treatment with  IV fluids and IV antibiotics.  His ability to take p.o. normally, I expect a  quick discharge from the hospital.   ALLERGIES:  No known drug allergies   MEDICATIONS:  1. Hytrin 5 mg p.o. q.h.s.  2. Sal palmetto tablets 2 tablets q.d.  3. Tylenol PM 2 at night for insomnia, but only take this rarely.   PAST MEDICAL HISTORY:  1. BPH followed by Dr. Loraine Leriche C. Ottelin.  2. Renal calculus in the past.  3. Erectile dysfunction.  4. Left ankle fracture in 1980.  5. Dyspepsia/GERD.  Take occasional Tums.  He has had negative H. Pylori.   PAST SURGICAL HISTORY:  1. Cystoscopy by Dr. Loraine Leriche C. Ottelin in the past.  2. Injuries to approximately two ribs and broke his left wrist in a fall     from a ladder years ago.   HEALTH MAINTENANCE:  1. Pneumovax in  1999.  2. Flu vaccine yearly.  3. Colonoscopy was performed on December 02, 2000 revealing universal colonic     diverticulosis, most extensive on the left.  4. Upper GI series in July 2002 to evaluate heart murmur.  Revealed GERD     with a hiatal hernia.   FAMILY HISTORY:  Noncontributory.   SOCIAL HISTORY:  No smoking.  No alcohol.  Married to Win Guajardo in his  second marriage.  First wife died of cancer.  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   REVIEW OF SYMPTOMS:  Nocturia x 1.  Good appetite for the last couple of  days.  No chest pain, shortness of breath, headache, neck pain.  Abdomen is  not painful but he  is nauseous and vomiting.  Occasional sciatica at attempt  walking.   PHYSICAL EXAMINATION:  GENERAL:  Well-developed, well-nourished male who is  nauseous and throwing up.  Feels dizzy.  VITAL SIGNS:  Height is usually 5 feet 4 inches, not taken today.  Weight  177.  Pulse 92 and regular, blood pressure 40/74.  Temperature is 101.6  orally.  HEENT:  Benign.  NECK:  Supple without JVD.  CHEST:  Clear to auscultation.  CARDIOVASCULAR:  Regular rhythm without murmur or gallop.  ABDOMEN:  Soft and nontender.  Normal bowel sounds and no rebound.  Nondistended.  GENITALIA:  Testes is normal and descended.  No appreciable masses.  RECTAL:  Normal anal sphincter tone.  Prostate is moderately enlarged,  tender, and boggy.  No rectal masses.  EXTREMITIES:  Without clubbing, cyanosis, or edema.  NEUROLOGICAL:  Nonfocal.  SKIN:  Large number of scattered senile angiomas but no rashes.   LABORATORY DATA:  Hemoglobin is 14.7 and white count elevated at 12,000, 87%  neutrophils.  Urinalysis reveals 1+ blood, rare white cells, 0 to 5 red  cells, rare bacteria.   IMPRESSION:  A 77 year old male with nausea and vomiting who likely has  acute prostate.  He could have diverticulitis.  He could have  diverticulitis.   PLAN:  The patient will be admitted,  treated with IV fluids, and IV Tequin.  He did get a dose of intramuscular Rocephin 1 gm x 1 as well as 25 mg of  Phenergan prior to transfer to [facility] where he will be  admitted to room 5731.  I suspect a very short admission perhaps less than  24 hours.                                                 Barbette Or, M.D.    RNG/MEDQ  D:  09/25/2001  T:  09/27/2001  Job:  [id number]

## 2010-06-16 NOTE — Procedures (Signed)
[facility]  Patient:    Clifford Spencer, Clifford Spencer Visit Number: [id number] MRN: [id number]          Service Type: Attending:  Verlin Grills, M.D. Proc. Date: 12/02/00   CC:         Pearla Dubonnet, M.D.   Procedure Report  PROCEDURE:  Diagnostic colonoscopy.  REFERRING PHYSICIAN:  Pearla Dubonnet, M.D.  PROCEDURE INDICATION:  Mr. Clifford Spencer (date of birth July 18, 1925) is a 77 year old male.  [name] was referred for an upper GI x-ray series in July 2002, to evaluate heartburn; his x-ray revealed gastroesophageal reflux associated with a hiatal hernia.  On August 28, 2000, he submitted stool for Hemoccult testing and two of six stool cards were positive for blood. [name] tells me he intermittently passes fresh blood with otherwise normal bowel movements but denies rectal pain or abdominal pain.  [name] viewed our colonoscopy education film in my  office, and I discussed with him the complications associated with colonoscopy and polypectomy, including a 15 per 1000 risk of bleeding and 4 per 1000 risk of colonic perforation requiring surgical repair. [name] has signed the operative permit.  MEDICATION ALLERGIES:  None.  CHRONIC MEDICATIONS:  Tylenol, ______  Viagra, Hytrin.  PAST MEDICAL HISTORY: 1. Benign prostatic hypertrophy. 2. Kidney stone. 3. Erectile dysfunction. 4. Left ankle fracture in 1980. 5. Gastroesophageal reflux. 6. Cystoscopy.  ENDOSCOPIST:  Verlin Grills, M.D.  PREMEDICATION:  Versed 5 mg, Demerol 30 mg.  ENDOSCOPE:  Olympus pediatric colonoscope.  DESCRIPTION OF PROCEDURE:  After obtaining informed consent, Mr. Clifford Spencer was placed in the left lateral decubitus position.  I administered intravenous Demerol and intravenous Versed to achieve conscious sedation for the procedure.  The patients blood pressure, oxygen saturation, and cardiac rhythm were monitored throughout the procedure and  documented in the medical record.  Anal inspection was normal.  Digital rectal exam revealed a small, hard prostate.  The Olympus pediatric video colonoscope was introduced into the rectum and advanced to the hepatic flexure with the patient in the left lateral decubitus position.  To intubate the right colon and advance the endoscope to the cecum, the patient was placed in the supine position. Colonic preparation for the exam today was excellent.  [name] has universal colonic diverticulosis without signs of diverticulitis or bleeding.  Colon diverticulosis is most extensive in the left colon.  RECTUM:  Normal.  SIGMOID COLON AND DESCENDING COLON:  Normal.  SPLENIC FLEXURE:  Normal.  TRANSVERSE COLON:  Normal.  HEPATIC FLEXURE:  Normal.  ASCENDING COLON:  Normal.  CECUM AND ILEOCECAL VALVE:  Normal.  ASSESSMENT:  Universal colonic diverticulosis; otherwise normal proctocolonoscopy to the cecum.  No endoscopic evidence for the presence of colorectal neoplasia, inflammatory bowel disease, or colonic bleeding. Attending:  Verlin Grills, M.D. DD:  12/02/00 TD:  12/03/00 Job: [id number]

## 2010-06-16 NOTE — Cardiovascular Report (Signed)
NAME:  Clifford Spencer, CASEBOLT NO.:  [id number]   MEDICAL RECORD NO.:  [id number]          PATIENT TYPE:  OIB   LOCATION:  2899                         FACILITY:  [facility]   PHYSICIAN:  Armanda Magic, M.D.     DATE OF BIRTH:  07-Jun-1925   DATE OF PROCEDURE:  DATE OF DISCHARGE:                              CARDIAC CATHETERIZATION   PROCEDURE:  A left heart catheterization, coronary angiography, left  ventriculography, __________   INDICATION:  Chest pain.   COMPLICATIONS:  None.   IV ACCESS:  Via right femoral artery, 6-French sheath.   This is a very pleasant 77 year old white male with no previous cardiac  history and does have, what appears to be, some undiagnosed hypertension,  who began having classic exertional anginal symptoms and presented for  exercise treadmill test, which was markedly positive for inducible ischemia,  now presents for cardiac catheterization.   The patient was brought to cardiac catheterization laboratory in a fasting  non-sedated state.  Informed consent was obtained.  The patient was  connected to continuous heart rate and pulse oximetry monitoring,  intermittent blood pressure monitoring.  The right groin was prepped and  draped in sterile fashion.  1% Xylocaine was used for local anesthesia.  Using modified Seldinger technique, a 6-French sheath was placed in the  right femoral artery.  Under fluoroscopic guidance, a 6-French JL4 catheter  was placed in the left coronary artery.  Multiple cine films were taken at  30 degree RAO and 40 degree LAO views.  Scapula was then exchanged out over  guidewire for a 6-French JR4 catheter, which was placed under fluoroscopic  guidance in the right coronary artery.  Multiple cine films were taken at 30  degree RAO and 40 degree LAO views.  This catheter was then exchanged out  over guidewire for a 6-French angled pigtail catheter, which was placed  under fluoroscopic guidance in the left ventricular  cavity.  Left  ventriculography was performed at 30 degree RAO view and a total of 30 cc  contrast at 15 cc per second.  Catheter was then pulled back across the  aortic valve with no significant __________  noted.  At the end procedure,  all catheters and sheaths were removed.  Manual compression was performed  until adequate hemostasis was obtained.  The patient was transferred back to  room in stable condition.   RESULTS:  1.  The left main coronary artery is heavily calcified and had a ruptured      plaque distally with at least a 70% stenosis.  It then bifurcates in      left anterior descending artery and left circumflex artery.  Left      anterior descending artery has ostial 60% narrowing at least and then      has diffuse irregularities up to 30% distally.  It gives rise to 1 large      diagonal branch, which trifurcates into 3 smaller branches, all of which      are widely patent.  2.  The left circumflex has a 70% mid stenosis before giving rise  to a very      large obtuse marginal branch.  The obtuse marginal branch has a long 50%      narrowing prior to trifurcating into 3 daughter branches, all of which      are widely patent.  3.  The right coronary artery is small and nondominant, diffusely diseased      up to 70% proximally.  4.  LV systolic function is normal.  Aortic pressure 174/82, LV pressure      176/-3.   ASSESSMENT:  1.  Severe 3-vessel coronary disease.  2.  Normal LV function.  3.  Hypertension with poorly-controlled blood pressure.   PLAN:  CVTS consult, IV heparin drip, admit to CCU or TCU, IV nitroglycerin  drip, aspirin and IV Integrilin      Armanda Magic, M.D.  Electronically Signed     TT/MEDQ  D:  03/16/2005  T:  03/16/2005  Job:  [id number]   cc:   Candyce Churn, M.D.  Fax: [phone]

## 2010-06-16 NOTE — Discharge Summary (Signed)
NAME:  Clifford Spencer, Clifford Spencer NO.:  [id number]   MEDICAL RECORD NO.:  [id number]                   PATIENT TYPE:  INP   LOCATION:  5725                                 FACILITY:  [facility]   PHYSICIAN:  Candyce Churn, MD            DATE OF BIRTH:  September 13, 1925   DATE OF ADMISSION:  11/26/2001  DATE OF DISCHARGE:  11/28/2001                                 DISCHARGE SUMMARY   DISCHARGE DIAGNOSES:  1. Urinary tract infection, probable pyelonephritis.  2. Right renal calculus, passed.  3. Benign prostatic hypertrophy with bladder outlet obstruction, chronic.  4. Abnormality noted on right kidney lower pole calyx, to be worked up     further per Dr. Loraine Leriche C. Ottelin as outpatient.  5. History of erectile dysfunction.  6. Universal diverticulosis.  7. Episodic sciatica.  8. Severe prostatitis -- August 2003.   DISCHARGE MEDICATIONS:  1. Tequin 400 mg daily for 10 days.  2. Flomax 0.4 mg two p.o. q.h.s.   CONSULTATIONS:  Dr. Maretta Bees. Peterson/Dr. Ihor Gully, November 26, 2001 --  urology.   HOSPITAL COURSE:  The patient is a pleasant 77 year old male with history of  BPH with bladder outlet obstruction with requirements for Foley catheter two  months ago at the time of prostatitis.   Two days prior to admission, the patient developed nausea and vomiting and  leukocytosis as well as pyuria and hematuria and was started on Phenergan  and Tequin as an outpatient.   The patient failed to take Phenergan while at home and vomited  intermittently for 24 more hours and then was admitted to control nausea and  vomiting and to further treat his UTI.   White count on admission was 18,100.  BUN and creatinine was 19 and 1.4.   Abdominal CT was obtained revealing evidence of mild right hydronephrosis  with a question of urothelial neoplasia in a right lower pole calyx; there  were also findings on CT to suggest renal calculus material in the bladder  with  evidence suggesting recent passage of a right renal calculus into the  bladder.   The patient had a Foley catheter placed and was started on intravenous  Tequin with marked diminution in relief of nausea and vomiting within 24  hours.   By the second day of admission, white count had come down to 13,300 and he  was afebrile.   Urinalysis on admission revealed large blood, small leukocyte esterase,  white cells were 11 to 20, red cells were 7 to 10 and there were a few  bacteria.   It should be noted that he had probably been able to keep down a couple of  doses of ciprofloxacin prior to admission.   Other laboratories on admission revealed normal LFTs and normal amylase and  lipase.   The patient was seen in consultation by Dr. Larey Dresser on November 26, 2001, who recommended  a possible voiding trial on November 27, 2001, and his  Foley was actually successfully removed on November 27, 2001 and a subsequent  postvoid residual was only 150 cc of urine.  On the day of admission,  postvoid residual prior to Foley placement was more than 400 cc.   The patient tolerated his catheter out the remaining 24 hours, with postvoid  residual of only 150 cc on the afternoon prior to discharge.   Dr. Vernie Ammons saw the patient on November 27, 2001 and recommended continuing  the Foley on discharge but with voiding well and feeling well and on an  increased dose of Flomax, we will go ahead and discharge him without a  catheter and he is to call immediately if he has any further abdominal or  flank pain, fever or nausea.   FOLLOWUP:  He should follow up with Dr. Vernie Ammons next week with possible  RPG/ureteroscopy to further work up the possibility of urothelial neoplasia  in the right lower pole of his kidney.  Hopefully, he will be able to avoid  TURP.                                                 Candyce Churn, MD    RNG/MEDQ  D:  11/28/2001  T:  11/28/2001  Job:  [id number]   cc:    Veverly Fells. Vernie Ammons, M.D.   Maretta Bees. Vonita Moss, M.D.  [address], [address]  [place]  [place] [address]  Fax: [phone]

## 2010-06-16 NOTE — H&P (Signed)
NAME:  Clifford Spencer, Clifford Spencer NO.:  [id number]   MEDICAL RECORD NO.:  [id number]                   PATIENT TYPE:  EMS   LOCATION:  MINO                                 FACILITY:  [facility]   PHYSICIAN:  Candyce Churn, M.D.          DATE OF BIRTH:  Oct 05, 1925   DATE OF ADMISSION:  05/10/2003  DATE OF DISCHARGE:                                HISTORY & PHYSICAL   CHIEF COMPLAINT:  Back pain/left hip pain radiating to the left ankle.   HISTORY OF PRESENT ILLNESS:  Since a significant fall down the stairs  landing on his right side, the patient has had left hip pain radiating to  the left ankle.   X-rays at [facility] were negative in January of 2005.   He has been treated with prednisone tapers twice, Tylenol, Darvocet, Toradol  and Ultram with no relief.  The patient's wife reports this morning that he  was unable to get out of bed because of pain and also he is having some  problems with constipation.  The patient was brought in for further  evaluation and we decided to admit him for pain control as well as MRI.   PAST MEDICAL HISTORY:  His medical history includes benign prostatic  hypertrophy, renal calculi, erectile dysfunction, left ankle fracture in  1980, dyspepsia/gastroesophageal reflux disease, sciatica.   PAST SURGICAL HISTORY:  Includes cystoscopy by Dr. Ihor Gully.   MEDICATIONS:  1. Hytrin 5 mg q.h.s. orally.  2. Tylenol PM, PRN.   ALLERGIES:  PYRIDIUM, HYDROCODONE, APAP, VICODIN.   FAMILY HISTORY:  Mother died in her 56's of breast cancer.  Father died at  44 years old of pneumonia.  One brother alive at 63 years old with good  health and also another brother age 46 with history of arthritis.   SOCIAL HISTORY:  Smoking:  None.  Alcohol: None.  He is married to his  second wife, Scarlette Calico and she accompanies him today.  His first wife died of  cancer.  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   REVIEW OF SYMPTOMS:  Please see  chief complaint.   PHYSICAL EXAMINATION:  GENERAL APPEARANCE:  Acute pain.  VITAL SIGNS:  Temperature 98.1, pulse 68, respirations 18, blood pressure  140/80.  HEENT:  Eyes pupils equal, round, reactive to light.  Extraocular movements  intact.  Funduscopic examination normal.  Ears with Weber and Rinne tests  normal.  Nose and throat unremarkable.  Dental plates upper and lower.  NECK:  Supple.  No thyroid enlargement or tenderness.  No lymphadenopathy or  carotid bruit.  CHEST:  Clear to auscultation.  HEART:  Normal S1 and S2 with no murmurs, rubs or gallops or clicks.  ABDOMEN:  Soft, normal bowel sounds.  GENITALIA:  Testicles normal and descended.  Rectal examination shows  prostate moderately enlarged, nontender, non boggy. No nodules.  No stool to  Guaiac.  EXTREMITIES:  No edema, decreased patellar reflex in left leg, equal  strength.  NEUROLOGICAL:  Oriented X3 plus the above information.   ASSESSMENT:  Worsening left hip pain with radiculopathy and poor pain  control at home.   PLAN:  Plan for lumbosacral MRI and pain control.      Lavinia Sharps, N.P.                     Candyce Churn, M.D.    MAP/MEDQ  D:  05/10/2003  T:  05/10/2003  Job:  [id number]

## 2010-06-16 NOTE — Consult Note (Signed)
NAME:  Clifford Spencer, Clifford Spencer NO.:  [id number]   MEDICAL RECORD NO.:  [id number]          PATIENT TYPE:  OIB   LOCATION:  2899                         FACILITY:  [facility]   PHYSICIAN:  Salvatore Decent. Dorris Fetch, M.D.DATE OF BIRTH:  [birth year]   DATE OF CONSULTATION:  03/16/2005  DATE OF DISCHARGE:                                   CONSULTATION   REASON FOR CONSULTATION:  Left main and three-vessel coronary artery  disease.   HISTORY OF PRESENT ILLNESS:  Clifford Spencer is a 77 year old gentleman who has  no prior cardiac history.  Back in the summer he began having some episodic  chest pressure with exertion.  This occurs in the midchest and radiates to  his right arm.  He had given up mowing his yard over the summary and the  symptoms improved somewhat, but over the past couple of months he has noted  that with any exertion he gets this pain, particularly when he takes his  trash out or even when he was carrying his granddaughter.  He has always had  relief with rest and has not had any rest pain.  He actually went to see Dr.  Kevan Spencer regarding some foot pain and while there told Clifford Spencer about this  problem, and he was referred to Clifford Spencer.  He had a positive stress  test and underwent cardiac catheterization today, which showed left main and  three-vessel disease in the setting of a left-dominant circulation.  His  right coronary was small and nondominant but was diffusely diseased.  The  patient is currently pain-free.   PAST MEDICAL HISTORY:  1.  Benign prostatic hypertrophy.  2.  Nephrolithiasis.  3.  Erectile dysfunction.  4.  Gastroesophageal reflux.  5.  Sciatica.  6.  Previous back surgery.  7.  Previous cystoscopy.   MEDICATIONS:  He was just recently started on baby aspirin and given  sublingual nitroglycerin, but he has not used any of the sublingual  nitroglycerin.   He is allergic to PYRIDIUM and VICODIN.   FAMILY HISTORY:  He has two brothers, both  of whom are alive and well.  his  father lived to 89.   SOCIAL HISTORY:  He is retired.  He used to work in a Insurance claims handler.  He now  works as a Chief Strategy Officer.  He is married to his second wife, who is  ill.  He does not use tobacco or alcohol.   REVIEW OF SYSTEMS:  Overall he has been doing well.  He has had some foot  pain, but he does not have any history of peripheral vascular disease,  varicose veins, deep venous thrombosis, stroke or TIA.  No recent change in  bowel or bladder habits.  He does still have some mild sciatica-type  symptoms, but they are much better since his surgery.   PHYSICAL EXAMINATION:  GENERAL:  Clifford Spencer is a 77 year old gentleman in no  acute distress.  His general appearance is well-developed.  He is thin but  well-nourished.  NEUROLOGIC:  He is alert and oriented x3.  He  is grossly intact.  HEENT:  Unremarkable.  NECK:  Supple without thyromegaly, adenopathy or bruits.  CARDIAC:  Regular rate and rhythm, normal S1, S2, no murmurs, rubs or  gallops.  LUNGS:  Clear with equal breath sounds bilaterally.  ABDOMEN:  Soft, nontender.  EXTREMITIES:  Without clubbing, cyanosis, or edema.  He has 2+ pulses  bilaterally.  SKIN:  Warm and dry.  VITAL SIGNS:  Blood pressure 117/66, pulse is 81, respirations are 13.   LABORATORY DATA:  EKG shows LVH.  His stress test showed downsloping ST in  the inferior leads.  He had an exaggerated blood pressure response as well.  His glucose was 111, BUN and creatinine 15 and 0.9, sodium 141, potassium  3.9.  hemoglobin 14, hematocrit 43, platelets 228.  PT was 11.7, PTT is 25.   IMPRESSION:  Clifford Spencer is a 77 year old gentleman who presents with  exertional angina.  He has not had any rest or nocturnal pain.  At  catheterization he has three-vessel disease with preserved left ventricular  function.  He needs coronary bypass grafting for revascularization for  survival benefit as well as relief of symptoms.  I have  discussed in detail  with the patient the nature and extent of the surgery, including the  incisions to be used, the need for general anesthesia, expected  postoperative stay and overall recovery.  I discussed with him the  indication, risks, benefits, and alternatives including risks including  death, stroke, MI, DVT, PE, bleeding, possible need for transfusions,  infection as well as other organ system dysfunction including respiratory,  renal or GI complications.  He understands and accepts these risks and  agrees to proceed. We will proceed with preoperative carotid Doppler  evaluation and plan to proceed with the surgery, coronary artery bypass  grafting to his LAD as well as OM distribution on Monday, first case.           ______________________________  Salvatore Decent. Dorris Fetch, M.D.     SCH/MEDQ  D:  03/16/2005  T:  03/16/2005  Job:  [id number]   cc:   Armanda Spencer, M.D.  Fax: [phone]   Candyce Churn, M.D.  Fax: [phone]

## 2010-06-16 NOTE — Consult Note (Signed)
NAME:  Clifford Spencer, Clifford Spencer                          ACCOUNT NO.:  [id number]   MEDICAL RECORD NO.:  [id number]                   PATIENT TYPE:  INP   LOCATION:  5725                                 FACILITY:  [facility]   PHYSICIAN:  Maretta Bees. Vonita Moss, M.D.             DATE OF BIRTH:  1925/12/31   DATE OF CONSULTATION:  11/26/2001  DATE OF DISCHARGE:                                   CONSULTATION   HISTORY OF PRESENT ILLNESS:  I was asked to see this 77 year old white male  who was admitted today with chills, fever, dysuria, and a UTI.  He was in  the emergency room and was felt to be dehydrated. His urinalysis showed a  large amount of blood and negative nitrites.  His creatinine was 1.4 and his  BUN was 19.  His white count was elevated at 18,100.  He also had some  recent right flank pain.  He has had a past history of a stone manipulation  in [employer] many years ago.   He had a CT scan today which I cannot locate but is reported as showing some  mild right hydronephrosis consistent with recent passage of a stone and  possibly some stony debris in the bladder and also a question of a filling  defect measuring 7 or 8 mm in size in the lower pole of the right calyceal  system.  He also has a long history of prostatism and has been a patient of  Dr. Vernie Ammons in the last few years during which time he has undergone workup  for hematuria in the past.  He had an IVP that was normal on the right side  and somewhat poorly visualized on the left side.  Renal ultrasound back then  in 2000 showed a left renal cyst.  Cystoscopy then was unremarkable except  for some prostatic calculi.   He has been on urosacin in the past, but Dr. Vernie Ammons had placed him on  Flomax.  He did have a UTI this summer associated with a bout of urinary  retention.  On 10/21/2001, a PVR showed 77 cc, and a PSA at that time was  3.39.  He also underwent cystoscopy in September, and there were no lesions  in the bladder  itself.  He is admitted now with IV fluids and has been  placed on Tequin.   PAST MEDICAL HISTORY:  His general health was quite good.  Before he was  admitted, Flomax and Advair were his only medications.   ALLERGIES:  Denied.   SOCIAL HISTORY:  He does not smoke or drink alcohol.   REVIEW OF SYSTEMS:  Noted on health history form.   PHYSICAL EXAMINATION:  VITAL SIGNS:  On admission, his blood pressure was  144/80, pulse 76, and respiratory rate was 18.  GENERAL:  He was alert and oriented.  SKIN:  Warm and dry.  ABDOMEN:  No  CVA or bladder tenderness.  No hepatosplenomegaly.  GU/RECTAL:  Penis, urethral meatus, scrotum, testicles, epididymes examined,  and Foley catheter was in place.  No lesions noted.  Perineum was normal,  and his sphincter tone was normal.  Prostate benign and smooth. No seminal  vesicle tissue palpated.   IMPRESSION:  1. Suspected acute urinary tract infection and prostatitis with history of     recurrent urinary tract infections.  2. Probable recently pass stone with a past history of urinary tract     calculus disease and probably responsible for mild right hydronephrosis.  3. Benign prostatic hypertrophy and prostatism on therapy of Flomax and     currently Foley catheter in place.  4. Question of filling defect in the right calyceal system which could be a     tumor or plaque.   PLAN:  Continue Flomax and Tequin and consider a voiding trial in the  morning as he will need followup on this question of a filling defect in the  pyelocalyceal system.                                               Maretta Bees. Vonita Moss, M.D.    LJP/MEDQ  D:  11/26/2001  T:  11/27/2001  Job:  [id number]   cc:   Candyce Churn, MD  [address] [place]  [place] [address]  Fax: [phone]

## 2010-06-16 NOTE — Op Note (Signed)
NAME:  Clifford Spencer, Clifford Spencer NO.:  [id number]   MEDICAL RECORD NO.:  [id number]          PATIENT TYPE:  INP   LOCATION:  2305                         FACILITY:  [facility]   PHYSICIAN:  Salvatore Decent. Dorris Fetch, M.D.DATE OF BIRTH:  08/22/1925   DATE OF PROCEDURE:  03/19/2004  DATE OF DISCHARGE:                                 OPERATIVE REPORT   PREOPERATIVE DIAGNOSIS:  Left main disease with exertional angina.   POSTOPERATIVE DIAGNOSIS:  Left main disease with exertional angina.   PROCEDURE:  Median sternotomy, extracorporeal circulation coronary artery  bypass grafting x3 (left internal mammary artery to LAD, sequential  saphenous vein graft to obtuse marginals 1 and 2), endoscopic vein harvest  right leg.   SURGEON:  Charlett Lango, M.D.   ASSISTANT:  Zadie Rhine, PA   ANESTHESIA:  General.   FINDINGS:  Good-quality targets, good-quality conduits.   CLINICAL NOTE:  Mr. Deshotel is a 77 year old gentleman who underwent cardiac  catheterization for work-up of exertional angina. At catheterization he had  left main disease and in a left dominant circulation. He also had some  disease in the small nondominant right. He had significant lesions also at  the takeoff of the LAD and circumflex. The patient was advised to undergo  coronary artery bypass grafting.  The indications, benefits and alternatives  discussed in detail with the patient.  He understood, accepted risks and  agreed to proceed.   OPERATIVE NOTE:  Mr. Swiney was brought the preop holding area on March 19, 2005. There lines was placed by anesthesia to monitor arterial, central  venous and pulmonary arterial pressure. ECG leads were placed with  continuous telemetry. Intravenous antibiotics were administered. The patient  is taken to the operating room, anesthetized and intubated. A Foley catheter  was placed.  The chest, abdomen and legs were prepped and draped in usual  fashion.   A median  sternotomy was performed. The left internal mammary artery was  harvested using standard technique. Simultaneously incision in the medial  aspect the right leg.  The greater saphenous vein was identified and  harvested from the right thigh endoscopically, it was of good quality as was  the mammary artery. 5000 units of heparin was administered during the  harvest of the vessels.  The remainder of the  full heparin dose was given  prior to dividing the left mammary.   The pericardium was opened. The ascending aorta was inspected. There is no  evidence of palpable atherosclerotic disease.  The aorta was cannulated via  concentric 2-0 Ethibond pledgeted pursestring sutures. A dual stage venous  cannula was placed via pursestring suture in the right atrial appendage.  Cardiopulmonary bypass was instituted, the patient's cooled to 32 degrees  Celsius. The coronary arteries were inspected and anastomotic sites chosen.  The conduits were inspected cut to length.  A foam pad was placed in  pericardium to protect the left phrenic nerve. A temperature probe was  placed in the myocardial septum and a cardioplegic cannula placed in the  ascending aorta.   The aorta was crossclamped, left ventricle was entered  via aortic root vent.  A cardiac arrest was achieved with combination of cold antegrade blood  cardioplegia, topical iced saline. After achieving complete diastolic  arrest, adequate myocardial septal cooling following distal anastomoses were  performed.   First a reverse saphenous vein graft was placed sequentially to obtuse  marginals 1 and 2. Obtuse marginal 1 was a 1.5 mm vessel.  OM2 was 1.8 mm.  Both were good quality.  The vein was good quality, it was anastomosed end-  to-side with running 7-0 Prolene suture. Both anastomoses were probed  proximally and distally at their completion.  There was excellent flow  through the graft. Cardioplegia was administered. There was good  hemostasis.   Next the left internal mammary artery was brought through a window in the  pericardium. The distal end was spatulated, it was anastomosed end-to-side  to the LAD with a running 8-0 Prolene suture.  At completion of the mammary  to LAD anastomosis, the bulldog clamps were briefly removed to inspect for  hemostasis. Immediate and rapid septal rewarming was noted.  Bulldog clamp  was replaced. The mammary pedicle was tacked epicardial surface of heart  with 6-0 Prolene sutures. Rewarming was begun.   The cardioplegic cannulas were removed from the ascending aorta. The  proximal vein graft anastomosis was performed to 4.5 mm punch aortotomy with  running 6-0 Prolene suture.  At completion of the proximal anastomoses, the  patient was placed in Trendelenburg position. The aortic root was de-aired.  The aortic crossclamp was removed. Total crossclamp time was 50 minutes.   While the patient was being rewarmed. all proximal and distal anastomoses  were inspected for hemostasis. Epicardial pacing wires were placed on the  right ventricle and right atrium. The patient been rewarmed to a core  temperature was 37 degrees Celsius, he was weaned from cardiopulmonary  bypass without difficulty. Total bypass time was 70 minutes.  The initial  cardiac index was greater than 2 liters per minute per meter squared. The  patient remained hemodynamically stable throughout post bypass period.   A test dose of protamine was administered and was well tolerated.  The  atrial and aortic cannulae were removed. Remainder of the protamine was  administered without incident. Chest irrigated with 1 liter of warm normal  saline containing 1 gram of vancomycin. Hemostasis was achieved. The left  pleural and two mediastinal chest tubes were placed through separate  subcostal incisions. The pericardium was reapproximated over the ascending aorta but not over the right ventricle. Left pleural and two  mediastinal  chest tubes were placed through separate subcostal incisions. The sternum  was closed with interrupted heavy gauge stainless steel wires. The remainder  of the incisions were closed in standard fashion. All sponge, needle and  sponge counts were correct at the procedure. The patient is taken from the  operating room to the surgical intensive care unit in critical but stable  condition.           ______________________________  Salvatore Decent Dorris Fetch, M.D.     SCH/MEDQ  D:  03/20/2005  T:  03/21/2005  Job:  [id number]   cc:   Armanda Magic, M.D.  Fax: [phone]   Candyce Churn, M.D.  Fax: [phone]

## 2010-06-16 NOTE — Op Note (Signed)
NAME:  Clifford Spencer, Clifford Spencer                          ACCOUNT NO.:  [id number]   MEDICAL RECORD NO.:  [id number]                   PATIENT TYPE:  INP   LOCATION:  3001                                 FACILITY:  [facility]   PHYSICIAN:  Donalee Citrin, M.D.                     DATE OF BIRTH:  [birth year]   DATE OF PROCEDURE:  05/28/2003  DATE OF DISCHARGE:                                 OPERATIVE REPORT   PREOPERATIVE DIAGNOSIS:  Severe spinal stenosis at L3-4, L4-5 and neurogenic  claudication and left side at L4 and L5 radiculopathies as well as possible  ruptured disk at L4-5.  Dorsal spinal mass, probable lipoma.   POSTOPERATIVE DIAGNOSES:  Same.   PROCEDURE:  Decompressive laminectomy at L3-4 and L4-5 on the left with  microscopic diskectomy at L4-5 on the left, and microscopic dissection of  the L4 and L5 nerve roots from the left.  Resection of dorsal spinal mass.   SURGEON:  Donalee Citrin, M.D.   ASSISTANT:  Reinaldo Meeker, M.D.   ANESTHESIA:  General endotracheal.   INDICATIONS FOR PROCEDURE:  This is a very pleasant 77 year old gentleman  who has had longstanding back and left leg pain who claudicated at less than  half a block. He was admitted to [facility] and was worked up with  MRI scan that showed severe spinal stenosis.  The patient's pain was under  good control.  The patient was discharged.  The patient has followed up with  me at the outpatient clinic with progressively worsening back and leg pain  refractory to all forms of conservative treatment.  Preoperative images  showed severe spinal stenosis with versatile complete CSF block at L3-4 and  severe spinal stenosis with possible ruptured disk at L4-5.  Due the patient  failure of conservative treatment, his debilitating pain, and his  preoperative imaging, the patient was recommended two-level decompressive  laminectomy on the left side as he had only unilateral symptoms with  evaluation of the disk spaces at L3-4  and L4-5.  After the risks and  benefits were explained, the patient consented for __________.   DESCRIPTION OF PROCEDURE:  The patient was brought to the OR, was induced  under general anesthesia, and was positioned prone on the Wilson frame.  Back was prepped and draped in the usual sterile fashion.  On prepping there  was noted to be a large dorsal spinal mass overlying the midline right where  the incision was needed to be.  After preoperative x-ray localized the 3-4  disk space, so after infiltration with lidocaine with epinephrine, a midline  incision was made and this fatty mass was resected.  Plane was developed  around it. The capsule was maintained intact and this was sent off to  pathology.  It had the appearance of a lipoma.  After this was extracted and  self-retaining retractor was placed,  Bovie electrocautery was used to  dissect the subcutaneous tissues and subperiosteal dissection was carried  out on the lamina of L3, L4, and L5 on the left side.  Then, intraoperative  x-ray confirmed the localization of the 3-4 disk space.  Using a high speed  drill, the inferior aspect of the facet complex and inferior aspect of  lamina of L3 was removed and then using 2 and 3 mm Kerrison punches,  attempts were made to get underneath the lamina of L3 and several bites were  taken, however, it was noted to be severely stenotic and I was unable to  develop a plane around the ligament.  So attention was then taken to L4-5.  Again drilling up the medial aspect of facet complex and inferior aspect of  L4, a 3 mm Kerrison punch was able to get under the lamina of L3 exposing  the ligament flavum, removed in a piecemeal fashion and exposing the thecal  sac.  Then using the thecal sac as a landmark, the complete L4 lamina was  removed.  Superior aspect of the lamina of L5 was removed up to about 2/3 of  the inferior aspect of the lamina of L3.  This exposed the thecal sac which  was noted to be  under a severe amount of compression, especially at L3-4  with  hourglass compression from the lateral facet complex on the left side.  Using a 4 Penfield the dura was freed up from the lateral facet complex at 3-  4 and 4-5 and then using a 3 __________, 2 and 3 mm Kerrison punches the  under gutter was underbitten with decompression of the lateral aspect of the  thecal sac.  The L3 nerve root was identified.  The disk space was  inspected.  The disk space at L3-4 was not felt to be herniated and the L3  neuroforamen was widely opened  up with a 2 and 3 mm Kerrison punches and  explored with a hockey stick and coronary dilator and noted to have no  further stenosis in the proximal aspect of the foramen.  Then looking at the  L4-5 disk space, the disk space itself was mildly bulging, however, in the  superior aspect of the disk space, a large free fragment had migrated  cephalad and up underneath the L3 nerve compressing the disk lodged at the  L3 nerve. This was all teased away with a blunt nerve hook and coronary  dilator and pituitary rongeurs were used to radically clean out this disk.  The undersurface and inferior aspect of the disk space was inspected.  The  was no inferior fragment appreciated.  The L5 nerve root was radically  decompressed at the neuroforamen and explored with a coronary dilator and  hockey stick and noted no further stenosis.  The disk space itself was not  incised due to the patient's degenerative scoliosis and degree of  decompression and it was felt that the disk space itself and annulus  appeared to be intact, due to the superior fragment had migrated from just  underneath the superior aspect of the disk space and the rim was not  appreciated.  So this was left alone. After the disk was completely cleaned  out from underneath the L4 root and the L5 root, the wound was reexplored.  No further stenosis was appreciated.  The wound was copiously irrigated. Gelfoam  was overlaid on top of the dura.  The muscle and fascia were  reapproximated with  0 interrupted Vicryl, subcutaneous tissue closed with 2-  0 interrupted Vicryl, and the skin was closed with a running 4-0  subcuticular, Benzoin and Steri-Strips applied.  The patient went to the  recovery room in stable condition.  At the end of the case, needle, sponge,  and instrument counts correct.                                               Donalee Citrin, M.D.    GC/MEDQ  D:  05/28/2003  T:  05/29/2003  Job:  [id number]

## 2010-06-16 NOTE — Discharge Summary (Signed)
NAME:  Clifford Spencer, GORRELL NO.:  [id number]   MEDICAL RECORD NO.:  [id number]          PATIENT TYPE:  INP   LOCATION:  2019                         FACILITY:  [facility]   PHYSICIAN:  Salvatore Decent. Dorris Fetch, M.D.DATE OF BIRTH:  1925/11/12   DATE OF ADMISSION:  03/16/2005  DATE OF DISCHARGE:                                 DISCHARGE SUMMARY   PRIMARY DIAGNOSIS:  Left main disease with exertional angina.   IN HOSPITAL DIAGNOSES:  1.  Acute blood loss anemia postoperatively.  2.  Postoperative atrial fibrillation.   SECONDARY DIAGNOSES:  1.  Benign prostatic hypertrophy.  2.  History of renal calculi.  3.  Erectile dysfunction.  4.  Gastroesophageal reflux disease.  5.  Sciatica.   ALLERGIES:  Patient states he is allergic to VICODIN and PYRIDIUM.   PROCEDURES/OPERATIONS:  1.  Cardiac catheterization.  2.  Coronary artery bypass grafting X3 using left internal mammary artery to      left anterior descending, sequential saphenous vein grafts to obtuse      marginal 1 and 2, endoscopic vein harvesting from right leg was done.   HISTORY, PHYSICAL EXAMINATION, HOSPITAL COURSE:  Clifford Spencer is a 77 year old  Caucasian male who has been experiencing episodic chest pressure both on the  right and left sides with occasional shortness of breath.  The patient was  seen by Dr. Mayford Knife.  An exercise stress test was done which showed this to  be abnormal.  The patient was then taken for cardiac catheterization.  This  showed left main disease and a left dominant circulation.  The patient also  had some disease in a small nondominant right.  He had significant stenosis  at the takeoff of the left anterior descending and circumflex.  Following  catheterization Dr. Dorris Fetch was consulted.  The patient was seen and  evaluated.  Dr. Dorris Fetch discussed with the patient undergoing coronary  artery bypass grafting.  The patient acknowledged understanding and agreed  to proceed.   Surgery was scheduled for March 19, 2005.  Prior to  undergoing surgery, the patient had bilateral carotid duplex ultrasound done  which showed no evidence of significant internal carotid artery stenosis.  The patient remained stable prior to surgery.   The patient was taken to the operating room on March 19, 2005 where he  underwent coronary artery bypass grafting X3 using left internal mammary  artery to the left anterior descending, sequential saphenous vein graft to  the obtuse marginal 1 and 2 and endoscopic vein harvesting from the right  leg.  The patient tolerated the procedure well and was transferred up to the  intensive care unit in stable condition.  The patient was extubated late  evening/early morning following his surgery.  He was seen to be alert and  oriented and was neurologically intact.  During the patient's inpatient  hospital course he did develop acute blood loss anemia.  Hemoglobin was down  to 8.7 and hematocrit 25.  The patient was asymptomatic and this was  monitored.  It remained stable during his hospital course and last  hemoglobin and hematocrit  obtained postoperative day #3 was 8.9 and 25.9.  On postoperative day #1 the patient went into postoperative atrial  fibrillation.  He was started on intravenous amiodarone.  The patient did  convert back to normal sinus rhythm later that day. He was switched to p.o.  amiodarone postoperative day #2. The patient remained in normal sinus rhythm  during the remainder of his hospital stay.  He will be continued on  amiodarone at discharge.   The patient's chest tubes and lines were discontinued in normal fashion.  He  was out of bed ambulating well.  The patient was transferred down to 2000 on  postoperative day #2.  The patient remained afebrile during his hospital  course.  Vital signs were monitored and medications were adjusted  appropriately.  He was able to be weaned off oxygen sating greater than 90%   prior to discharge home.  The patient did have a minimal amount of serous  drainage from the distal portion of his incision.  No cellulitis was noted  and this had cleared up prior to discharge home.  Leg incisions were dry,  intact and healing well.  Postoperatively the patient was tolerating diet  well and had no nausea or vomiting.  Bowel movements were within normal  limits.  Respiratory status was followed with chest x-rays which were  stable.   DISPOSITION:  The patient is tentatively ready for discharge home on  postoperative day #2, March 24, 2005.  Follow up appointment has been  scheduled with Dr. Dorris Fetch for April 13, 2005 at 11:15 A.M.  The patient  is to follow up with Dr. Mayford Knife in two weeks.  He will obtain a PA and  lateral chest x-ray prior to his appointment which he will bring with him to  Dr. Sunday Corn office.  Clifford Spencer received instructions on diet,  activity level and incisional care.  He was told no driving until he is  released to do so.  No heavy lifting over 10 pounds.  The patient was told  to ambulate three to four times per day, progress as tolerated and to  continue his breathing exercises.  He is told he is allowed to shower,  washing his incisions using soap and water.  He is to contact the office if  he develops any drainage or any opening from any of his incision sites.  The  patient was educated on diet to be low fat, low salt.  He acknowledges  understanding.   DISCHARGE MEDICATIONS:  1.  Aspirin 325 mg p.o. daily.  2.  Lopressor 25 mg b.i.d.  3.  Lisinopril 10 mg daily.  4.  Lipitor 10 mg q.h.s.  5.  Amiodarone 400 mg b.i.d. X2 weeks then 200 mg b.i.d.  6.  Darvocet-N 100 one to two tabs q.4-6h. PRN pain.      [name], PA    ______________________________  Salvatore Decent Dorris Fetch, M.D.    KMD/MEDQ  D:  03/23/2005  T:  03/23/2005  Job:  [id number]  cc:   Armanda Magic, M.D.  Fax: [phone]

## 2010-06-16 NOTE — Op Note (Signed)
NAME:  Clifford Spencer, Clifford Spencer                          ACCOUNT NO.:  [id number]   MEDICAL RECORD NO.:  [id number]                   PATIENT TYPE:  AMB   LOCATION:  [facility]                                 FACILITY:  [facility]   PHYSICIAN:  Mark C. Vernie Ammons, M.D.               DATE OF BIRTH:  [birth year]   DATE OF PROCEDURE:  01/19/2003  DATE OF DISCHARGE:                                 OPERATIVE REPORT   PREOPERATIVE DIAGNOSES:  1. Bladder calculus.  2. Benign prostatic hypertrophy with bladder outlet obstruction.   POSTOPERATIVE DIAGNOSES:  1. Bladder calculus.  2. Benign prostatic hypertrophy with bladder outlet obstruction.   OPERATION/PROCEDURE:  1. Cystolithalopaxy.  2. Transurethral incision of the prostate.   SURGEON:  Mark C. Vernie Ammons, M.D.   ANESTHESIA:  General.   ESTIMATED BLOOD LOSS:  Less than 5 mL.   SPECIMENS:  None.   DRAINS:  20-French Foley catheter.   COMPLICATIONS:  None.   INDICATIONS:  The patient is a 77 year old white male who presented to me  last month with suprapubic discomfort, frequent small voiding, increase in  nocturia which had been present for approximately one week.  His urine  appeared grossly positive and he was started on antibiotics and his urine  was cultured but had no growth.  He continued to have significant irritative  symptoms consisting of urgency, frequency, and urge incontinence with some  dysuria.  In fact, his culture was positive back in November, so I retreated  with antibiotics while awaiting culture.  The culture was negative.  He,  therefore, returned for IVP to further investigate and was found to have  normal upper tracts but a large bladder stone.  He has no bladder outlet  obstruction.  Likely the cause of this bladder calculus and he is brought to  the OR today for surgical treatment.  We discussed the procedure, its risks,  complications and alternatives.   DESCRIPTION OF PROCEDURE:  After informed consent, the patient was  taken to  the OR, placed on the table, administered general anesthesia, then moved to  a dorsal lithotomy position.  The genitalia was sterilely prepped and draped  and initially a 22-French cystoscope with 12-degree lens was inserted in the  urethra and the urethra was noted to be normal down to the sphincter which  appears intact.  The prosthetic urethra revealed elongation and bilobar  hypertrophy with a high median lobe/bladder neck component.  The bladder  itself had 1+ trabeculation but was free of any tumors or inflammatory  lesions.  The stone was seen on the floor of the bladder.   The 550 micron laser fiber was then chosen and used to completely fragment  the stone after which I used the Ellik evacuator to remove all fragments  from the bladder.  Once the bladder was completely free of stone, I  initially used the laser to try to incise in the  midline of the prostate at  the bladder neck, but the power was not adequate.  I, therefore, switched to  the [employer].  I inserted the resectoscope sheath with Timberlake  obturator, removing the obturator, inserted the resectoscope element with  the Collins knife and performed transurethral incision of the prostate in  the midline at the 6 o'clock position, cutting through the bladder neck and  prostatic urethral fibers and then extending this incision back to the level  of the verumontanum.  Bleeding points were cauterized.  I then removed the  cystoscope and inserted a 20-French Foley catheter.  The irrigation returned  clear.  The patient was awakened and taken to the recovery room in stable  and satisfactory condition.  He tolerated the procedure well with no  intraoperative complications.   The catheter will remain indwelling as long as it clears in the recovery  room and will be removed prior to his discharge.  He will be given a  prescription for 24 Vicodin ES and 24 200 mg Pyridium and will return to my  office in  approximately 10 days.                                               Mark C. Vernie Ammons, M.D.    MCO/MEDQ  D:  01/19/2003  T:  01/19/2003  Job:  [id number]

## 2010-06-16 NOTE — Discharge Summary (Signed)
NAME:  Clifford Spencer, Clifford Spencer NO.:  [id number]   MEDICAL RECORD NO.:  [id number]                   PATIENT TYPE:  INP   LOCATION:  0456                                 FACILITY:  [facility]   PHYSICIAN:  Candyce Churn, M.D.          DATE OF BIRTH:  Oct 07, 1925   DATE OF ADMISSION:  05/10/2003  DATE OF DISCHARGE:  05/14/2003                                 DISCHARGE SUMMARY   DISCHARGE DIAGNOSES:  1. Gait disorder secondary to severe sciatica and inability to walk,     resolved.  2. L4-5 herniated disk with left nerve root impingement at that level.  L4-5     and L5-S1 herniated disk also compromising the L5-S1 nerve root.  3. Benign prostatic hypertrophy.   DISCHARGE MEDICATIONS:  1. Hytrin 5 mg p.o. q.h.s.  2. Dilaudid 2 mg p.o. q.6-8h. p.r.n. pain.  3. Senokot S two p.o. a.m. and p.m.  4. Colace 100 mg t.i.d.   CONSULTATIONS:  Hilda Lias, M.D., May 13, 2003, neurosurgery.   PROCEDURES:  MRI of the lumbar spine with and without contrast with the  following:  Multi-factorial spinal stenosis and lateral recess stenosis.  Significant spinal stenosis at L3-4 level.  At L1-2, there was mild spinal  stenosis.  At L2-3 lateral extension of bulging protruding disk material  ____________L2 nerve roots, right greater than left.  At L3-4, severe multi-  factorial spinal stenosis with bilateral recess stenosis with right slightly  greater than left significant neural foraminal narrowing.  At L4-5, multi-  factorial moderate to marked bilateral stenosis with a left lateral  foraminal disk protrusion contributing to left greater than right marked  lateral recess stenosis and neural foraminal narrowing with encroachment  upon the course of the exiting left L4 nerve root.  At L5-S1, moderate broad  based disk protrusion with marked spinal stenosis and left greater than  right lateral recess stenosis with marked left-sided L5-S1 neural foraminal  narrowing with  encroachment upon the exiting left L5 nerve root.   HOSPITAL COURSE:  Clifford Spencer is a very pleasant 76 year old male with a  history of BPH and fairly severe sciatica of the left lower extremity which  initially occurred after a fall down stairs several months ago, then he was  simply turning in his yard and pulling on some weeds, and felt a severe pain  over the last week.  On the day of admission, he could not get out of bed  because his pain was so severe, and he was brought to the hospital because  of inability to walk.   He has had two steroid tapers, including medication with Tylenol, Darvocet,  Toradol, and Ultram with no relief.   He was admitted to expedite workup for possible neurosurgery and for severe  pain and inability to walk.   The patient was admitted and seen in consultation by Dr. Hilda Lias who  recommended either a selective  nerve root injection at the level of L4-5 or  to proceed with surgery to decompress the L4-5 and L5-S1 nerves on the left  side.  Mr. Bublitz was somewhat of afraid of the surgery and wanted to  further discuss it with me, and on discussion with me the following day he  decided that he needed to think about it longer, he wanted to go home and  contemplate whether or not to have the surgery.   Fortunately, his pain did improve while hospitalized, and he was able to  ambulate with pain at the time of discharge.   He is to be followed up in the office in 3 to 4 days for further  consideration for surgery.  Dr. Lynne Logan Botero's consultation was  appreciated.   DISCHARGE LABORATORY DATA:  White blood cell count of 11,900, hemoglobin  13.9, platelet count 205,000.  PT 12.5, PTT 27.  CMET within normal limits  except for a glucose of 118, BUN of 28, and total protein of 5.6, albumin of  2.9.  Creatinine is 1.1, potassium 4.  Bicarbonate 31.  LFT's were normal.                                               Candyce Churn, M.D.     RNG/MEDQ  D:  05/30/2003  T:  05/30/2003  Job:  [id number]   cc:   Hilda Lias, M.D.  [address]  [place], [place] [address]  Fax: [phone]

## 2010-06-16 NOTE — Consult Note (Signed)
NAME:  Clifford Spencer, Clifford Spencer                          ACCOUNT NO.:  [id number]   MEDICAL RECORD NO.:  [id number]                   PATIENT TYPE:  INP   LOCATION:  0456                                 FACILITY:  [name]   PHYSICIAN:  Hilda Lias, M.D.                DATE OF BIRTH:  Oct 29, 1925   DATE OF CONSULTATION:  05/13/2003  DATE OF DISCHARGE:                                   CONSULTATION   HISTORY OF PRESENT ILLNESS:  Clifford Spencer is a gentleman who was admitted to  the hospital with a history of back pain with radiation down the left leg,  all the way down to the left ankle, for almost 4 months.  The patient is  getting worse.  He cannot control the pain, and, in view of that, it was  decided for Clifford Spencer to be admitted for pain control in the hospital.  He  has been here since May 10, 2003.  He denies any pain to the right leg.  He denies any sensory changes.  He tells me that the pain is getting to the  point that he is __________ with his life.  He has had in the past  prednisone, Tylenol, Darvocet, and Toradol without any improvement.  Because  of the medication he had been taking in the past, he has had constipation.   Clinically, the sensation is completely normal.  He has no weakness  whatsoever.  There is no pain to the right leg.  The left leg has a femoral  strength maneuver, which is positive on the left side.  He is unable to  stand.  The weight when he stands is on the right leg.  He immediately  flexed the left leg.   The MRI showed multiple levels of degenerative disk disease.  He has a  stenosis at L3-4.  He has a herniated disk at L4-5 bilaterally, left worse  that the right one.  At the level of L5-S1, he also has a bilaterally  herniated disk, mostly going to the left side, compromising the L5-S1 root.   I mentioned to him about the findings.  I told him the MRI goes along with  the findings he is having, and that right now the two choices would be to  send him  to have a selected nerve root infection at the level of L4-5 to see  if that will help and keep him away from surgery, and the other one would be  to proceed with surgery to decompress the L4-5, L5-S1 on the left side.  He  does not want to make a decision.  He wants to talk to Dr. Kevan Ny and to his  wife.  There was a note in the chart to call his wife, and I talked to Mrs.  Smoker at [phone], and I mentioned to her what I just mentioned to [name].  She  is going to talk to Dr. Kevan Ny and to her husband, and they will  try to made a decision about what to do next.                                               Hilda Lias, M.D.    EB/MEDQ  D:  05/13/2003  T:  05/13/2003  Job:  [id number]   cc:   Candyce Churn, M.D.  [address] [place]  [place] [address]  Fax: [phone]

## 2010-11-13 ENCOUNTER — Emergency Department (HOSPITAL_COMMUNITY): Payer: Medicare Other

## 2010-11-13 ENCOUNTER — Inpatient Hospital Stay (HOSPITAL_COMMUNITY)
Admission: EM | Admit: 2010-11-13 | Discharge: 2010-11-20 | DRG: 884 | Disposition: A | Discharge status: Skilled Nursing Facility | Payer: Medicare Other | Attending: Internal Medicine | Admitting: Internal Medicine

## 2010-11-13 DIAGNOSIS — E785 Hyperlipidemia, unspecified: Secondary | ICD-10-CM | POA: Diagnosis present

## 2010-11-13 DIAGNOSIS — K219 Gastro-esophageal reflux disease without esophagitis: Secondary | ICD-10-CM | POA: Diagnosis present

## 2010-11-13 DIAGNOSIS — M6282 Rhabdomyolysis: Secondary | ICD-10-CM | POA: Diagnosis present

## 2010-11-13 DIAGNOSIS — A498 Other bacterial infections of unspecified site: Secondary | ICD-10-CM | POA: Diagnosis present

## 2010-11-13 DIAGNOSIS — R1310 Dysphagia, unspecified: Secondary | ICD-10-CM | POA: Diagnosis present

## 2010-11-13 DIAGNOSIS — Z9181 History of falling: Secondary | ICD-10-CM

## 2010-11-13 DIAGNOSIS — D72829 Elevated white blood cell count, unspecified: Secondary | ICD-10-CM | POA: Diagnosis present

## 2010-11-13 DIAGNOSIS — F068 Other specified mental disorders due to known physiological condition: Principal | ICD-10-CM | POA: Diagnosis present

## 2010-11-13 DIAGNOSIS — I2510 Atherosclerotic heart disease of native coronary artery without angina pectoris: Secondary | ICD-10-CM | POA: Diagnosis present

## 2010-11-13 DIAGNOSIS — B961 Klebsiella pneumoniae [K. pneumoniae] as the cause of diseases classified elsewhere: Secondary | ICD-10-CM | POA: Diagnosis present

## 2010-11-13 DIAGNOSIS — F411 Generalized anxiety disorder: Secondary | ICD-10-CM | POA: Diagnosis present

## 2010-11-13 DIAGNOSIS — N390 Urinary tract infection, site not specified: Secondary | ICD-10-CM | POA: Diagnosis present

## 2010-11-13 DIAGNOSIS — I10 Essential (primary) hypertension: Secondary | ICD-10-CM | POA: Diagnosis present

## 2010-11-13 DIAGNOSIS — Z8673 Personal history of transient ischemic attack (TIA), and cerebral infarction without residual deficits: Secondary | ICD-10-CM

## 2010-11-13 DIAGNOSIS — N400 Enlarged prostate without lower urinary tract symptoms: Secondary | ICD-10-CM | POA: Diagnosis present

## 2010-11-13 DIAGNOSIS — R319 Hematuria, unspecified: Secondary | ICD-10-CM | POA: Diagnosis present

## 2010-11-13 DIAGNOSIS — Z951 Presence of aortocoronary bypass graft: Secondary | ICD-10-CM

## 2010-11-13 LAB — GLUCOSE, CAPILLARY: Glucose-Capillary: 151 mg/dL — ABNORMAL HIGH (ref 70–99)

## 2010-11-13 LAB — BASIC METABOLIC PANEL
BUN: 22 mg/dL (ref 6–23)
Calcium: 9.7 mg/dL (ref 8.4–10.5)
Chloride: 101 mEq/L (ref 96–112)
Creatinine, Ser: 1.19 mg/dL (ref 0.50–1.35)
GFR calc Af Amer: 63 mL/min — ABNORMAL LOW (ref >90)
GFR calc non Af Amer: 55 mL/min — ABNORMAL LOW (ref >90)

## 2010-11-13 LAB — CBC
HCT: 41.3 % (ref 39.0–52.0)
MCH: 29.1 pg (ref 26.0–34.0)
MCV: 89.6 fL (ref 78.0–100.0)
Platelets: 237 10*3/uL (ref 150–400)
RBC: 4.61 MIL/uL (ref 4.22–5.81)

## 2010-11-13 LAB — DIFFERENTIAL
Eosinophils Absolute: 0 10*3/uL (ref 0.0–0.7)
Lymphocytes Relative: 9 % — ABNORMAL LOW (ref 12–46)
Lymphs Abs: 1.5 10*3/uL (ref 0.7–4.0)
Monocytes Relative: 9 % (ref 3–12)
Neutrophils Relative %: 82 % — ABNORMAL HIGH (ref 43–77)

## 2010-11-13 LAB — HEPATIC FUNCTION PANEL
ALT: 12 U/L (ref 0–53)
Alkaline Phosphatase: 88 U/L (ref 39–117)
Bilirubin, Direct: <0.1 mg/dL (ref 0.0–0.3)
Total Bilirubin: 0.4 mg/dL (ref 0.3–1.2)

## 2010-11-13 LAB — URINE MICROSCOPIC-ADD ON

## 2010-11-13 LAB — URINALYSIS, ROUTINE W REFLEX MICROSCOPIC
Bilirubin Urine: NEGATIVE
Glucose, UA: NEGATIVE mg/dL
Ketones, ur: 15 mg/dL — AB
Protein, ur: 100 mg/dL — AB
pH: 5.5 (ref 5.0–8.0)

## 2010-11-13 LAB — PROTIME-INR: Prothrombin Time: 14.3 seconds (ref 11.6–15.2)

## 2010-11-14 LAB — CK TOTAL AND CKMB (NOT AT ARMC)
CK, MB: 6.4 ng/mL (ref 0.3–4.0)
Total CK: 678 U/L — ABNORMAL HIGH (ref 7–232)

## 2010-11-14 LAB — LIPASE, BLOOD: Lipase: 15 U/L (ref 11–59)

## 2010-11-14 LAB — RAPID URINE DRUG SCREEN, HOSP PERFORMED
Amphetamines: NOT DETECTED
Benzodiazepines: NOT DETECTED

## 2010-11-14 LAB — CARDIAC PANEL(CRET KIN+CKTOT+MB+TROPI)
CK, MB: 9.6 ng/mL (ref 0.3–4.0)
Relative Index: 0.7 (ref 0.0–2.5)
Total CK: 1256 U/L — ABNORMAL HIGH (ref 7–232)
Total CK: 1378 U/L — ABNORMAL HIGH (ref 7–232)

## 2010-11-14 LAB — AMMONIA: Ammonia: 27 umol/L (ref 11–60)

## 2010-11-15 LAB — URINE MICROSCOPIC-ADD ON

## 2010-11-15 LAB — URINALYSIS, ROUTINE W REFLEX MICROSCOPIC
Bilirubin Urine: NEGATIVE
Specific Gravity, Urine: 1.019 (ref 1.005–1.030)
Urobilinogen, UA: 0.2 mg/dL (ref 0.0–1.0)

## 2010-11-15 LAB — T4, FREE: Free T4: 1.21 ng/dL (ref 0.80–1.80)

## 2010-11-15 LAB — URINE CULTURE: Colony Count: NO GROWTH

## 2010-11-15 LAB — CBC
Hemoglobin: 12.6 g/dL — ABNORMAL LOW (ref 13.0–17.0)
MCH: 29.2 pg (ref 26.0–34.0)
MCV: 90 fL (ref 78.0–100.0)
RBC: 4.32 MIL/uL (ref 4.22–5.81)

## 2010-11-15 LAB — COMPREHENSIVE METABOLIC PANEL
BUN: 13 mg/dL (ref 6–23)
CO2: 25 mEq/L (ref 19–32)
Chloride: 107 mEq/L (ref 96–112)
Creatinine, Ser: 1.05 mg/dL (ref 0.50–1.35)
GFR calc non Af Amer: 64 mL/min — ABNORMAL LOW (ref >90)
Total Bilirubin: 0.5 mg/dL (ref 0.3–1.2)

## 2010-11-15 LAB — ALKALINE PHOSPHATASE: Alkaline Phosphatase: 79 U/L (ref 39–117)

## 2010-11-15 LAB — TSH: TSH: 4.569 u[IU]/mL — ABNORMAL HIGH (ref 0.350–4.500)

## 2010-11-16 ENCOUNTER — Other Ambulatory Visit: Payer: Self-pay | Admitting: Internal Medicine

## 2010-11-16 LAB — BASIC METABOLIC PANEL
BUN: 11 mg/dL (ref 6–23)
Calcium: 9.2 mg/dL (ref 8.4–10.5)
Creatinine, Ser: 1.07 mg/dL (ref 0.50–1.35)
GFR calc Af Amer: 72 mL/min — ABNORMAL LOW (ref >90)
GFR calc non Af Amer: 62 mL/min — ABNORMAL LOW (ref >90)
Potassium: 3.6 mEq/L (ref 3.5–5.1)

## 2010-11-17 LAB — CBC
Platelets: 197 10*3/uL (ref 150–400)
RBC: 4.14 MIL/uL — ABNORMAL LOW (ref 4.22–5.81)
RDW: 14 % (ref 11.5–15.5)
WBC: 8.8 10*3/uL (ref 4.0–10.5)

## 2010-11-17 NOTE — Consult Note (Signed)
NAME:  Clifford Spencer, LONGEST NO.:  [id number]  MEDICAL RECORD NO.:  [id number]  LOCATION:  1436                         FACILITY:  [facility]  PHYSICIAN:  Willo Yoon C. Vernie Ammons, M.D.  DATE OF BIRTH:  25-Sep-1925  DATE OF CONSULTATION:  11/16/2010 DATE OF DISCHARGE:                                CONSULTATION   HISTORY:  Clifford Spencer is an 77 year old male patient who I have seen in the past.  I saw him last in June 2007.  He is seen in hospital consultation today for further evaluation of urethral bleeding/hematuria.  The patient reports having intermittent mild dysuria for 4 days.  This has been associated with hematuria.  He does have this dementia and therefore his history is somewhat questionable. However, he denies any severe urethral pain.  He has not had any GU/urethral instrumentation during his hospitalization and has been noted by his nurse to have had blood at the meatus recently.  A condom catheter was applied to collect urine in order to evaluate it and the urine cleared.  However, small clot was then found at the urethral meatus once again.  He has had hematuria while he has been in the hospital.  He reports to me he has not noted any change in his voiding pattern.  His hematuria would be considered of mild severity.  It is not modified by time, day, or position.  He does have significant past urologic history.  His past urologic history is significant for having had a prostatic nodule noted on digital rectal exam and therefore in Clifford Spencer 2004 with a PSA of 2.2, a prostate biopsy was performed and found to be negative. He has a long history of microscopic hematuria and obstructive voiding symptoms and therefore in December 2004, he underwent cystolitholapaxy and at that time, I performed a transurethral incision of his prostate to reduce his outlet resistance, in an attempt at preventing further stone formation.  In addition, he has erectile dysfunction and a  history of renal calculus disease.  PAST MEDICAL HISTORY: 1. Hypertension. 2. Dementia. 3. History of CVA. 4. BPH. 5. GERD. 6. Hyperlipidemia. 7. History of coronary artery disease. 8. Chronic anxiety. 9. Urologic history as above.  SURGICAL HISTORY: 1. He has had coronary artery bypass grafting x3. 2. Back surgery. 3. Urologic surgeries as noted above.  MEDICATIONS ON ADMISSION: 1. Finasteride. 2. Metoprolol. 3. Paxil. 4. Tamsulosin. 5. Klonopin. 6. Xanax.  ALLERGIES:  NKDA.  SOCIAL HISTORY:  Unreliable, however, the patient lives at home by himself and denies alcohol or tobacco use.  FAMILY HISTORY:  Noncontributory.  REVIEW OF SYSTEMS:  Reveals no CVA or abdominal tenderness.  His urologic review of systems is as noted above.  Otherwise, his review of systems including all 13 points is negative at this time.  PHYSICAL EXAMINATION:  VITAL SIGNS:  Temperature is 97.8, pulse 70, respirations 16, blood pressure is 164/78. GENERAL:  The patient is a well-developed, well-nourished white male in no apparent distress.  HEENT:  Atraumatic, normocephalic.  Oropharynx is clear. NECK:  Supple with midline trachea. CHEST:  Reveals normal respiratory effort. CARDIOVASCULAR:  Regular rate and rhythm. ABDOMEN:  Soft and nontender without mass  or HSM.  There is no suprapubic mass or tenderness. BACK:  Reveals no CVA tenderness. GU:  Reveals normal circumcised phallus with a normal glans and meatus. No discharge or blood at the meatus.  His scrotum is normal.  Testicles are descended bilaterally, equal in size and consistency without nodularity.  His perineum is normal.  He has normal rectal tone.  His prostate is enlarged, but smooth and symmetric and has no suspicious nodularity or induration. SKIN:  Warm and dry. NEUROLOGIC:  He is alert and appears oriented with no gross focal neurologic deficits.  LABORATORY DATA:  His creatinine is 1.07.  PSA 2.07, and a urine  culture done on October 15 was negative.  Urinalysis done on October 17 showed 3 to 6 white blood cells, too numerous to count red blood cells, and many bacteria.  KUB done on October 15 revealed no definite bladder stones, although there was a calcification just to the right of the midline, although it was below the symphysis pubis and could potentially be a stone in the prostatic urethra, although that is not definite.  I see no definite bladder stones.  No other abnormalities noted.  CT scan done on November 20, 2009, revealed bilateral renal cysts which this patient is known to have, but no renal calculi are identified within the kidneys.  The bladder had no stones and the prostate was noted to be enlarged.  IMPRESSION: 1. He has bilateral renal cysts of no clinical significance. 2. Benign prostatic hypertrophy with outlet obstruction.  He is     currently on maximum medical management with 5-alpha reductase     inhibitor as well as an alpha blocker.  He likely will need to stay     on those 3.  He has a history of prostate nodule, although I find     no nodularity on examination today.  His PSA is normal, which is     where it has been essentially unchanged since 1998. 3. He has had gross hematuria during this hospitalization.  The     differential diagnosis would include urothelial malignancy,     however, in this patient, with a history of bladder calculi, a more     likely explanation would be recurrence of bladder calculi.  Another     possible cause for his hematuria could be benign prostatic bleeding     and then possibly even a bladder infection that has been hospital-     acquired as his culture initially was found to be negative.  He is     currently minimally symptomatic at present and I feel a     conservative approach is warranted.  RECOMMENDATIONS: 1. Await urine culture results and treat according to sensitivity. 2. Continue conservative management with  cystoscopy as an outpatient     to evaluate for stone and to rule out bladder tumor.     Darielle Hancher C. Vernie Ammons, M.D.     MCO/MEDQ  D:  11/16/2010  T:  11/16/2010  Job:  [id number]  cc:   Pearla Dubonnet, M.D. Fax: [phone]  Electronically Signed by Ihor Gully M.D. on 11/17/2010 06:52:45 AM

## 2010-11-18 LAB — URINE CULTURE

## 2010-11-20 LAB — IMMUNOFIXATION ADD-ON

## 2010-11-20 LAB — PROTEIN ELECTROPH W RFLX QUANT IMMUNOGLOBULINS
Albumin ELP: 49.9 % — ABNORMAL LOW (ref 55.8–66.1)
Alpha-1-Globulin: 4.4 % (ref 2.9–4.9)
Alpha-2-Globulin: 12 % — ABNORMAL HIGH (ref 7.1–11.8)
Total Protein ELP: 6.5 g/dL (ref 6.0–8.3)

## 2010-11-20 LAB — CULTURE, BLOOD (ROUTINE X 2): Culture: NO GROWTH

## 2010-11-21 LAB — IGG, IGA, IGM
IgG (Immunoglobin G), Serum: 1110 mg/dL (ref 650–1600)
IgM, Serum: 95 mg/dL (ref 41–251)

## 2010-11-21 NOTE — H&P (Signed)
NAME:  Clifford Spencer, MCEWAN NO.:  [id number]  MEDICAL RECORD NO.:  [id number]  LOCATION:  WLED                         FACILITY:  [facility]  PHYSICIAN:  Tarry Kos, MD       DATE OF BIRTH:  April 21, 1925  DATE OF ADMISSION:  11/13/2010 DATE OF DISCHARGE:                             HISTORY & PHYSICAL   CHIEF COMPLAINT:  Confusion.  HPI:  Clifford Spencer is an elderly 77 year old male with dementia who is brought in by his family members because they found him today at his house disheveled, naked, and confused.  He does have known dementia and he lives alone.  Clifford Spencer himself cannot answer questions appropriately.  He is communicative but is not oriented to person, place, or time.  He does deny pain.  He says that he slid off his recliner this morning.  He has had a pretty extensive workup in the ED and we cannot find any organic or infectious reason for his confusion that would be contributing to this issue.  He is very pleasant and he is wanting to go home.  He denies any pain.  There are no family members around right now.  REVIEW OF SYSTEMS:  Otherwise unobtainable.  PAST MEDICAL HISTORY: 1. Hypertension. 2. Dementia. 3. History of CVA in the past with no residual effects. 4. BPH. 5. GERD. 6. Hyperlipidemia. 7. History of coronary artery disease status post 3 vessel bypass. 8. Chronic anxiety. 9. Back surgery.  SOCIAL HISTORY:  Unobtainable.  All I know is he lives alone at home by himself.  ALLERGIES:  None.  MEDICATIONS:  Finasteride, metoprolol, Paxil, tamsulosin, Klonopin, and Xanax.  PHYSICAL EXAMINATION:  VITAL SIGNS:  Temperature is 98.4, blood pressure 154/129, pulse 80, respirations 16, 98% O2 sats on room air. GENERAL:  He is alert, in no apparent distress.  He knows his name but does not know place or time. HEENT:  Extraocular motions is intact.  Pupils equal and react to light. Oropharynx is clear.  Mucous membranes moist.  NECK:  No JVD.   No carotid bruits. COR:  Regular rate and rhythm without murmurs, rubs, or gallops. CHEST:  Clear to auscultation bilaterally.  No wheeze or rales. ABDOMEN:  Soft, nontender, nondistended.  Positive bowel sounds.  No hepatosplenomegaly. EXTREMITIES:  No clubbing, cyanosis, or edema. PSYCH:  Normal affect.  No agitation. NEURO:  No focal or neurologic deficits.  Moves all extremities, 5/5 strength upper and lower extremities.  LABORATORY DATA:  Urinalysis is clean, however, does show some ketones and coags are normal.  LFTs are normal.  Electrolytes are normal.  CBC is normal with the exception of a white count is 16.7.  Troponin is negative.  Chest x-ray is negative.  CT of his head shows no acute issues.  A 12-lead EKG shows Aflutter with very poor quality.  No acute ST or T wave changes.  ASSESSMENT/PLAN:  This is an 77 year old male with confusion and dementia.  Confusion probably related to his dementia, cannot find a reason for this exacerbation except maybe some mild dehydration.  We will place him on some IV fluids overnight.  We will observe him overnight and obtain a  social work consult.  We will also serial his cardiac enzymes to rule him out for MI, however, he is asymptomatic and not complaining of any chest pain.  We will monitor him closely on telemetry and monitor for any fevers, however, there is no report of any fevers and he is afebrile now.  Further recommendation pending over hospital course.          ______________________________ Tarry Kos, MD     RD/MEDQ  D:  11/13/2010  T:  11/14/2010  Job:  [id number]  Electronically Signed by Tarry Kos MD on 11/21/2010 03:26:34 AM

## 2010-11-30 NOTE — Discharge Summary (Signed)
  NAME:  Clifford Spencer, Clifford Spencer NO.:  [id number]  MEDICAL RECORD NO.:  [id number]  LOCATION:  1436                         FACILITY:  [facility]  PHYSICIAN:  Pearla Dubonnet, M.D.DATE OF BIRTH:  1925/11/10  DATE OF ADMISSION:  11/13/2010 DATE OF DISCHARGE:  11/20/2010                              DISCHARGE SUMMARY   ADDENDUM:  The patient has a long history of anxiety and had been taken off clonazepam on admission because of his confusion, but it is quite likely that his confusion was secondary to UTI.  On the morning of discharge, he does feel more anxious than usual.  Vital signs are normal, afebrile, no dysuria.  He did have an Ultram dose last night for mild low back pain, which worked quite well for him, but it is not known whether that has made him feel more anxious as well.  Certainly possible.  We will plan to resume Klonopin 1 mg at bedtime as he used to take, which did very well for his chronic anxiety and insomnia.  Needs to be followed carefully to make sure he is not sedated or confused from that.  He does have Xanax ordered q.12 hours p.r.n. anxiety during the day.  I suspect he will tolerate the Klonopin well and that his mental status changes may have been secondary to a UTI all along.  DISCHARGE MEDICATIONS:  Will be as per discharge summary addendum earlier today with the addition of Klonopin 1 mg at bedtime.     Pearla Dubonnet, M.D.     RNG/MEDQ  D:  11/20/2010  T:  11/20/2010  Job:  [id number]  Electronically Signed by Marden Noble M.D. on 11/30/2010 02:31:58 PM

## 2010-11-30 NOTE — Discharge Summary (Signed)
  NAME:  STRAN, RAPER NO.:  [id number]  MEDICAL RECORD NO.:  [id number]  LOCATION:  1436                         FACILITY:  [facility]  PHYSICIAN:  Pearla Dubonnet, M.D.DATE OF BIRTH:  May 15, 1925  DATE OF ADMISSION:  11/13/2010 DATE OF DISCHARGE:  11/20/2010                              DISCHARGE SUMMARY   ADDENDUM:  Mr. Scheel confusion continued to improve.  He was found to have a urinary tract infection that may have partially complicated with urethral bleeding.  He had greater than 100,000 colonies of E. coli and Klebsiella pneumonia, and both organisms were sensitive to ciprofloxacin.  He was started on ciprofloxacin 250 mg p.o. b.i.d. and we will continue those for a total of 7 days.  It was started on November 18, 2010, and should be discontinued on November 25, 2010.  No other medication changes were made from prior discharge medication list.  The patient will be discharged to [facility] for PT/OT and further observation and evaluation with hopes that he might be able to transfer back home or to assisted living.  Again the only change in medications from discharge summary medication list on November 17, 2010, was the addition of ciprofloxacin 250 mg p.o. b.i.d. for a total of 7 days and for 5 days after discharge from [facility].  CONDITION ON DISCHARGE:  Improved.     Pearla Dubonnet, M.D.     RNG/MEDQ  D:  11/20/2010  T:  11/20/2010  Job:  [id number]  Electronically Signed by Marden Noble M.D. on 11/30/2010 02:31:32 PM

## 2010-11-30 NOTE — Discharge Summary (Signed)
NAME:  Clifford Spencer, Clifford Spencer NO.:  [id number]  MEDICAL RECORD NO.:  [id number]  LOCATION:  1436                         FACILITY:  [facility]  PHYSICIAN:  Pearla Dubonnet, M.D.DATE OF BIRTH:  Jul 09, 1925  DATE OF ADMISSION:  11/13/2010 DATE OF DISCHARGE:  11/17/2010                              DISCHARGE SUMMARY   DISCHARGE DIAGNOSES: 1. Moderate dementia. 2. Hypertension. 3. Delirium, improved. 4. History of cerebrovascular accident in the past with no residual     effects. 5. Benign prostatic hypertrophy with history of transurethral incision     of the prostate and cystolitholapaxy on January 19, 2003 - Dr.     Loraine Leriche C. Ottelin. 6. Gastroesophageal reflux disease. 7. Hyperlipidemia. 8. Coronary artery disease with 3 vessel bypass. 9. Chronic anxiety. 10.History of back surgery. 11.Urethral bleeding intermittently with history of bladder calculi     and prostatic nodules - workup to be completed as an outpatient     with cystoscopy per Dr. Ihor Gully planned.  ALLERGIES:  No known drug allergies.  DISCHARGE MEDICATIONS: 1. Alprazolam 0.25 mg q.12 hours p.r.n. anxiety. 2. Paxil 10 mg daily. 3. Finasteride 5 mg orally daily. 4. Tamsulosin 0.4 mg daily at bedtime. 5. Metoprolol tartrate 25 mg b.i.d. 6. Pantoprazole 40 mg daily.  CONSULTATIONS:  Urology Dr. Ihor Gully.  PROCEDURES: 1. Two dimensional echocardiogram performed on November 15, 2010,     revealed an ejection fraction of 55-60%.  No regional wall motion     abnormality.  Mild mitral regurgitation.  Pulmonary artery systolic     pressure mildly increased at 32 mmHg.  No significant change on two     dimensional echocardiogram performed on December 10, 2008.  He was     considered to have grade 1 diastolic dysfunction by Doppler     parameters. 2. Chest x-ray from November 13, 2010, revealed a prior coronary artery     bypass graft and peribronchial thickening and interstitial     prominence,  but improvement since previous x-ray from November     2011.  Pelvic x-ray on November 13, 2010, revealed some degenerative     disk changes in the spine, but no pelvic fracture or sacral     fracture.  CT scan of the head without contrast was nonacute with     no evidence of large infarct.  There was no intracranial mass     lesion detected on unenhanced exam.  There was global atrophy     without hydrocephalus and there were small vessel disease changes.  DISCHARGE LABORATORIES:  As follows:  CBC from November 17, 2010, the white count 8800, hemoglobin 12.4, platelet count 197,000.  Urine culture from November 15, 2010, has been reincubated for better growth. CK from November 16, 2010, was 644, which had been declining since admission.  CK on admission was 989 and felt to be secondary to mild rhabdomyolysis secondary to fall at home prior to admission.  Other laboratories while hospitalized include a basic metabolic profile with a sodium 138, potassium 3.6, chloride 104, bicarb 27, glucose 137, BUN 11, creatinine 1.07.  On November 16, 2010, PSA was normal at 2.07, and urinalysis from  November 15, 2010, actually had many bacteria, but only 3- 6 white cells, and too numerous to count red cells.  B12 level was 244 - normal, sed rate 20 mm/hour on November 15, 2010, and TSH was 4.57. Borderline elevated and free T4 was normal at 1.21.  Blood cultures x2 from November 13, 2010, were no growth to date at discharge.  HOSPITAL COURSE:  Clifford Spencer is a pleasant 77 year old male who has been living at home by himself but tended to very carefully by his family including Kathlene November and Nita Sells.  Their numbers are [phone] and [phone].  On the day of admission, the patient was found in his home, disheveled, naked and confused.  He had been becoming more confused over the previous 2 weeks according to the family.  He reported that he slid off his recliner and could not get back up.  He was brought to  [facility] and evaluated and admitted.  He responded very well to hydration and was started on aspirin for possible diagnosis of TIA.  With rehydration, he appeared to become more lucid and has become less confused and is conversive and recognizes his family and me and knows he is in the hospital.  It is felt by his family and by me that he is becoming too unstable to live at home and a trial should be instituted at a nursing home with physical therapy and occupational therapy to see if he is able to continue to live on his own.  On admission, he did have a mild leukocytosis that resolved with hydration and may have been secondary to his fall, appeared his white count was 45409.  Never was febrile while hospitalized.  On November 14, 2010, he developed some urethral bleeding and was seen in consultation by Dr. Ihor Gully who plans to perform cystoscopy as an outpatient.  In terms of his following medical problems: 1. Dementia - slowly progressive.  We will plan for skilled nursing     facility transfer with the hope that he can be transitioned further     to assisted living.  Doubt that he will be able to go home. 2. Hypertension - controlled. 3. Diastolic dysfunction - on beta-blockers. 4. History of mild CVA in the past with no residual effects. 5. BPH - continue finasteride and tamsulosin. 6. Urethral bleeding - to be followed by Dr. Ihor Gully. 7. GERD - continue Protonix. 8. Hyperlipidemia - check fasting lipid profile as outpatient. 9. History of CAD with 3 vessel bypass with no evidence or symptoms of     ischemia or chest pain. 10.Chronic anxiety - Klonopin was discontinued and he can use p.r.n.     alprazolam.  Certainly, the Klonopin could have been contributing     to confusion at home. 11.History of back surgery in the past.  The patient should be a no code Blue, he has been in the past.  This should be further discussed with his family and him on  admission to the nursing home and decision formally made about that status.  CONDITION ON DISCHARGE:  Much improved.  Time spent examining the patient, reviewing chart and performing discharge summary was 35 minutes.     Pearla Dubonnet, M.D.     RNG/MEDQ  D:  11/17/2010  T:  11/17/2010  Job:  [id number]  cc:   Veverly Fells. Vernie Ammons, M.D. Fax: [phone]  Electronically Signed by Marden Noble M.D. on 11/30/2010 02:31:12 PM

## 2012-06-22 ENCOUNTER — Encounter (HOSPITAL_COMMUNITY): Payer: Self-pay | Admitting: *Deleted

## 2012-06-22 ENCOUNTER — Emergency Department (HOSPITAL_COMMUNITY): Payer: Medicare Other

## 2012-06-22 ENCOUNTER — Inpatient Hospital Stay (HOSPITAL_COMMUNITY)
Admission: EM | Admit: 2012-06-22 | Discharge: 2012-06-27 | DRG: 871 | Disposition: A | Discharge status: Skilled Nursing Facility | Payer: Medicare Other | Attending: Internal Medicine | Admitting: Internal Medicine

## 2012-06-22 DIAGNOSIS — F0390 Unspecified dementia without behavioral disturbance: Secondary | ICD-10-CM | POA: Diagnosis present

## 2012-06-22 DIAGNOSIS — E876 Hypokalemia: Secondary | ICD-10-CM | POA: Diagnosis present

## 2012-06-22 DIAGNOSIS — R319 Hematuria, unspecified: Secondary | ICD-10-CM | POA: Diagnosis present

## 2012-06-22 DIAGNOSIS — I10 Essential (primary) hypertension: Secondary | ICD-10-CM | POA: Diagnosis present

## 2012-06-22 DIAGNOSIS — A419 Sepsis, unspecified organism: Principal | ICD-10-CM | POA: Diagnosis present

## 2012-06-22 DIAGNOSIS — Z951 Presence of aortocoronary bypass graft: Secondary | ICD-10-CM

## 2012-06-22 DIAGNOSIS — Z66 Do not resuscitate: Secondary | ICD-10-CM | POA: Diagnosis present

## 2012-06-22 DIAGNOSIS — K219 Gastro-esophageal reflux disease without esophagitis: Secondary | ICD-10-CM | POA: Diagnosis present

## 2012-06-22 DIAGNOSIS — I2510 Atherosclerotic heart disease of native coronary artery without angina pectoris: Secondary | ICD-10-CM | POA: Diagnosis present

## 2012-06-22 DIAGNOSIS — R7881 Bacteremia: Secondary | ICD-10-CM | POA: Diagnosis present

## 2012-06-22 DIAGNOSIS — Z87442 Personal history of urinary calculi: Secondary | ICD-10-CM

## 2012-06-22 DIAGNOSIS — R197 Diarrhea, unspecified: Secondary | ICD-10-CM | POA: Diagnosis present

## 2012-06-22 DIAGNOSIS — T50995A Adverse effect of other drugs, medicaments and biological substances, initial encounter: Secondary | ICD-10-CM | POA: Diagnosis present

## 2012-06-22 DIAGNOSIS — F3289 Other specified depressive episodes: Secondary | ICD-10-CM | POA: Diagnosis present

## 2012-06-22 DIAGNOSIS — R4189 Other symptoms and signs involving cognitive functions and awareness: Secondary | ICD-10-CM | POA: Diagnosis present

## 2012-06-22 DIAGNOSIS — R310 Gross hematuria: Secondary | ICD-10-CM | POA: Diagnosis present

## 2012-06-22 DIAGNOSIS — N390 Urinary tract infection, site not specified: Secondary | ICD-10-CM | POA: Diagnosis present

## 2012-06-22 DIAGNOSIS — R195 Other fecal abnormalities: Secondary | ICD-10-CM | POA: Diagnosis not present

## 2012-06-22 DIAGNOSIS — R5381 Other malaise: Secondary | ICD-10-CM | POA: Diagnosis present

## 2012-06-22 DIAGNOSIS — N489 Disorder of penis, unspecified: Secondary | ICD-10-CM | POA: Diagnosis present

## 2012-06-22 DIAGNOSIS — N400 Enlarged prostate without lower urinary tract symptoms: Secondary | ICD-10-CM | POA: Diagnosis present

## 2012-06-22 DIAGNOSIS — J189 Pneumonia, unspecified organism: Secondary | ICD-10-CM | POA: Diagnosis present

## 2012-06-22 DIAGNOSIS — J40 Bronchitis, not specified as acute or chronic: Secondary | ICD-10-CM | POA: Diagnosis present

## 2012-06-22 DIAGNOSIS — E785 Hyperlipidemia, unspecified: Secondary | ICD-10-CM | POA: Diagnosis present

## 2012-06-22 DIAGNOSIS — F329 Major depressive disorder, single episode, unspecified: Secondary | ICD-10-CM | POA: Diagnosis present

## 2012-06-22 DIAGNOSIS — N4889 Other specified disorders of penis: Secondary | ICD-10-CM | POA: Clinically undetermined

## 2012-06-22 DIAGNOSIS — G9349 Other encephalopathy: Secondary | ICD-10-CM | POA: Diagnosis present

## 2012-06-22 DIAGNOSIS — G9340 Encephalopathy, unspecified: Secondary | ICD-10-CM | POA: Diagnosis present

## 2012-06-22 DIAGNOSIS — E860 Dehydration: Secondary | ICD-10-CM | POA: Diagnosis present

## 2012-06-22 DIAGNOSIS — K920 Hematemesis: Secondary | ICD-10-CM | POA: Diagnosis present

## 2012-06-22 DIAGNOSIS — R509 Fever, unspecified: Secondary | ICD-10-CM

## 2012-06-22 HISTORY — DX: Urinary tract infection, site not specified: N39.0

## 2012-06-22 HISTORY — DX: Gastro-esophageal reflux disease without esophagitis: K21.9

## 2012-06-22 HISTORY — DX: Unspecified osteoarthritis, unspecified site: M19.90

## 2012-06-22 HISTORY — DX: Essential (primary) hypertension: I10

## 2012-06-22 HISTORY — DX: Atherosclerotic heart disease of native coronary artery without angina pectoris: I25.10

## 2012-06-22 HISTORY — DX: Unspecified dementia, unspecified severity, without behavioral disturbance, psychotic disturbance, mood disturbance, and anxiety: F03.90

## 2012-06-22 MED ORDER — SODIUM CHLORIDE 0.9 % IV SOLN
1000.0000 mL | INTRAVENOUS | Status: DC
  Administered 2012-06-23: 1000 mL via INTRAVENOUS

## 2012-06-22 MED ORDER — SODIUM CHLORIDE 0.9 % IV SOLN
1000.0000 mL | Freq: Once | INTRAVENOUS | Status: AC
  Administered 2012-06-22: 1000 mL via INTRAVENOUS

## 2012-06-22 MED ORDER — ACETAMINOPHEN 650 MG RE SUPP
RECTAL | Status: AC
  Filled 2012-06-22: qty 1

## 2012-06-22 MED ORDER — ACETAMINOPHEN 650 MG RE SUPP
650.0000 mg | Freq: Once | RECTAL | Status: AC
  Administered 2012-06-22: 650 mg via RECTAL

## 2012-06-22 MED ORDER — VANCOMYCIN HCL IN DEXTROSE 1-5 GM/200ML-% IV SOLN
1000.0000 mg | Freq: Once | INTRAVENOUS | Status: AC
  Administered 2012-06-23: 1000 mg via INTRAVENOUS
  Filled 2012-06-22: qty 200

## 2012-06-22 MED ORDER — PIPERACILLIN-TAZOBACTAM 3.375 G IVPB
3.3750 g | Freq: Once | INTRAVENOUS | Status: AC
  Administered 2012-06-22: 3.375 g via INTRAVENOUS
  Filled 2012-06-22: qty 50

## 2012-06-22 MED ORDER — SODIUM CHLORIDE 0.9 % IV SOLN
1000.0000 mL | Freq: Once | INTRAVENOUS | Status: AC
  Administered 2012-06-23: 1000 mL via INTRAVENOUS

## 2012-06-22 NOTE — ED Provider Notes (Signed)
History     CSN: [id number]  Arrival date & time 06/22/12  2248   First MD Initiated Contact with Patient 06/22/12 2308       chief complaint: Fever, weakness, vomiting blood    HPI Patient is rough the emergency department because of discomfort and pain with urination as well as hematemesis at home.  As reported there is blood in the commode.  Family brought him to the emergency department on arrival emergency [facility] the patient became extremely weak and was too weak to get out of the car and they pulled up in the parking lot.  He was found to have a fever 104.2.  He reports no altered mental status.  He has had some tremors tonight.  He lives independently.  He has a history coronary artery disease as well as UTI.  He also has a history of gastroesophageal reflux disease.  Denies abdominal pain.  No back pain or flank pain.  He has had some cough without significant shortness of breath.  No neck pain or neck stiffness.  No rash.  His symptoms are mild to moderate in severity.   Past Medical History  Diagnosis Date  . Coronary artery disease   . UTI (lower urinary tract infection)   . GERD (gastroesophageal reflux disease)   . Arthritis     Past Surgical History  Procedure Laterality Date  . Back surgery    . Cardiac surgery      No family history on file.  History  Substance Use Topics  . Smoking status: Never Smoker   . Smokeless tobacco: Not on file  . Alcohol Use: No      Review of Systems  All other systems reviewed and are negative.    Allergies  Review of patient's allergies indicates not on file.  Home Medications  No current outpatient prescriptions on file.  BP 175/68  Pulse 87  Temp(Src) 104.2 F (40.1 C) (Rectal)  Resp 16  SpO2 90%  Physical Exam  Nursing note and vitals reviewed. Constitutional: He is oriented to person, place, and time. He appears well-developed and well-nourished.  HENT:  Head: Normocephalic and atraumatic.  Eyes: EOM  are normal.  Neck: Normal range of motion.  Cardiovascular: Normal rate, regular rhythm, normal heart sounds and intact distal pulses.   Pulmonary/Chest: Effort normal and breath sounds normal. No respiratory distress.  Abdominal: Soft. He exhibits no distension. There is no tenderness.  Genitourinary: Rectum normal.  Normal rectal exam.  Brown stool.  Hemoccult negative.  [address] Foley catheter in place with a small amount of blood at the urethral meatus around the Foley.  Musculoskeletal: Normal range of motion.  Neurological: He is alert and oriented to person, place, and time.  Skin: Skin is warm and dry.  Psychiatric: He has a normal mood and affect. Judgment normal.    ED Course  Procedures (including critical care time)   Date: 06/22/2012  Rate: 86  Rhythm: normal sinus rhythm  QRS Axis: normal  Intervals: normal  ST/T Wave abnormalities: normal  Conduction Disutrbances: none  Narrative Interpretation: poor baseline  Old EKG Reviewed: No significant changes noted     Labs Reviewed  CBC WITH DIFFERENTIAL - Abnormal; Notable for the following:    WBC 2.7 (*)    Neutrophils Relative % 83 (*)    Lymphs Abs 0.4 (*)    Monocytes Relative 1 (*)    Monocytes Absolute 0.0 (*)    All other components within normal  limits  COMPREHENSIVE METABOLIC PANEL - Abnormal; Notable for the following:    Glucose, Bld 146 (*)    Albumin 3.3 (*)    Alkaline Phosphatase 139 (*)    Total Bilirubin 0.2 (*)    GFR calc non Af Amer 52 (*)    GFR calc Af Amer 60 (*)    All other components within normal limits  URINALYSIS, ROUTINE W REFLEX MICROSCOPIC - Abnormal; Notable for the following:    APPearance CLOUDY (*)    Hgb urine dipstick LARGE (*)    Protein, ur 30 (*)    Leukocytes, UA SMALL (*)    All other components within normal limits  CG4 I-STAT (LACTIC ACID) - Abnormal; Notable for the following:    Lactic Acid, Venous 6.11 (*)    All other components within  normal limits  CG4 I-STAT (LACTIC ACID) - Abnormal; Notable for the following:    Lactic Acid, Venous 2.55 (*)    All other components within normal limits  CULTURE, BLOOD (ROUTINE X 2)  CULTURE, BLOOD (ROUTINE X 2)  URINE CULTURE  TROPONIN I  PROTIME-INR  PROCALCITONIN  URINE MICROSCOPIC-ADD ON   Dg Chest 2 View  06/23/2012   *RADIOLOGY REPORT*  Clinical Data: Altered mental status  CHEST - 2 VIEW  Comparison: 11/13/2010  Findings: Sternotomy wires overlie normal cardiac silhouette. There is mild air space disease in the left lower lobe. Remote posterior left seventh rib fracture.  IMPRESSION: Concern for left lower lobe pneumonia.   Original Report Authenticated By: Genevive Bi, M.D.   I personally reviewed the imaging tests through PACS system I reviewed available ER/hospitalization records through the EMR    1. CAP (community acquired pneumonia)   2. Fever       MDM  Patient with initial fever 104.2 but his other unit hemodynamics are completely normal except for his pulse ox of 90%.  He was placed on nasal cannula.  He appears to have a left lower lobe pneumonia.  Initially the patient was started on vancomycin and Zosyn for suspected sepsis without a clear source.  I added Levaquin for community-acquired pneumonia coverage.  Blood cultures and urine cultures pending.  His urinalysis is equivocal but Zosyn will cover that.  His initial lactate came back at 6 which was concerning for occult sepsis.  I discussed his case with critical care Dr. Sung Amabile who requests checking lactate clearance after 2 L normal saline bolus.  Clearance of his lactate is appropriate with the lactate now 2.55 after 2 L.  This is much greater than the 20-25% necessary to demonstrate improvement.   He will need to be admitted to the step down unit.        Lyanne Co, MD 06/23/12 [phone]

## 2012-06-22 NOTE — ED Notes (Signed)
Pt found by daughter this evening, usually independent, urinating blood, tremors, had daughter to witness blood intoilet, ont he way to hospital, nauseated, unable to walk upon arrival

## 2012-06-23 ENCOUNTER — Inpatient Hospital Stay (HOSPITAL_COMMUNITY): Payer: Medicare Other

## 2012-06-23 ENCOUNTER — Encounter (HOSPITAL_COMMUNITY): Payer: Self-pay | Admitting: Internal Medicine

## 2012-06-23 DIAGNOSIS — J189 Pneumonia, unspecified organism: Secondary | ICD-10-CM | POA: Diagnosis present

## 2012-06-23 DIAGNOSIS — I2510 Atherosclerotic heart disease of native coronary artery without angina pectoris: Secondary | ICD-10-CM | POA: Diagnosis present

## 2012-06-23 DIAGNOSIS — G9340 Encephalopathy, unspecified: Secondary | ICD-10-CM | POA: Diagnosis present

## 2012-06-23 DIAGNOSIS — N390 Urinary tract infection, site not specified: Secondary | ICD-10-CM | POA: Diagnosis present

## 2012-06-23 DIAGNOSIS — N400 Enlarged prostate without lower urinary tract symptoms: Secondary | ICD-10-CM | POA: Diagnosis present

## 2012-06-23 DIAGNOSIS — R319 Hematuria, unspecified: Secondary | ICD-10-CM | POA: Diagnosis present

## 2012-06-23 LAB — URINALYSIS, ROUTINE W REFLEX MICROSCOPIC
Bilirubin Urine: NEGATIVE
Glucose, UA: NEGATIVE mg/dL
Ketones, ur: NEGATIVE mg/dL
Nitrite: NEGATIVE
pH: 5.5 (ref 5.0–8.0)

## 2012-06-23 LAB — CBC WITH DIFFERENTIAL/PLATELET
Eosinophils Absolute: 0 10*3/uL (ref 0.0–0.7)
Eosinophils Absolute: 0 10*3/uL (ref 0.0–0.7)
Lymphocytes Relative: 15 % (ref 12–46)
Lymphocytes Relative: 2 % — ABNORMAL LOW (ref 12–46)
Lymphs Abs: 0.4 10*3/uL — ABNORMAL LOW (ref 0.7–4.0)
Lymphs Abs: 0.3 10*3/uL — ABNORMAL LOW (ref 0.7–4.0)
Neutro Abs: 2.2 10*3/uL (ref 1.7–7.7)
Neutrophils Relative %: 83 % — ABNORMAL HIGH (ref 43–77)
Neutrophils Relative %: 93 % — ABNORMAL HIGH (ref 43–77)
Platelets: 177 10*3/uL (ref 150–400)
Platelets: 158 10*3/uL (ref 150–400)
RBC: 4.55 MIL/uL (ref 4.22–5.81)
RBC: 4.04 MIL/uL — ABNORMAL LOW (ref 4.22–5.81)
WBC: 2.7 10*3/uL — ABNORMAL LOW (ref 4.0–10.5)
WBC: 20.9 10*3/uL — ABNORMAL HIGH (ref 4.0–10.5)

## 2012-06-23 LAB — COMPREHENSIVE METABOLIC PANEL
ALT: 15 U/L (ref 0–53)
Alkaline Phosphatase: 139 U/L — ABNORMAL HIGH (ref 39–117)
BUN: 20 mg/dL (ref 6–23)
CO2: 21 mEq/L (ref 19–32)
CO2: 24 mEq/L (ref 19–32)
Chloride: 107 mEq/L (ref 96–112)
Creatinine, Ser: 1.24 mg/dL (ref 0.50–1.35)
GFR calc Af Amer: 60 mL/min — ABNORMAL LOW (ref >90)
GFR calc Af Amer: 60 mL/min — ABNORMAL LOW (ref >90)
GFR calc non Af Amer: 52 mL/min — ABNORMAL LOW (ref >90)
Glucose, Bld: 146 mg/dL — ABNORMAL HIGH (ref 70–99)
Potassium: 3.6 mEq/L (ref 3.5–5.1)
Sodium: 140 mEq/L (ref 135–145)
Total Bilirubin: 0.4 mg/dL (ref 0.3–1.2)
Total Protein: 7.8 g/dL (ref 6.0–8.3)

## 2012-06-23 MED ORDER — LEVOFLOXACIN IN D5W 750 MG/150ML IV SOLN
750.0000 mg | INTRAVENOUS | Status: DC
  Administered 2012-06-25: 750 mg via INTRAVENOUS
  Filled 2012-06-23: qty 150

## 2012-06-23 MED ORDER — ONDANSETRON HCL 4 MG/2ML IJ SOLN: 4.0000 mg | Freq: Four times a day (QID) | INTRAMUSCULAR | Status: DC | PRN

## 2012-06-23 MED ORDER — METOPROLOL TARTRATE 25 MG PO TABS
25.0000 mg | ORAL_TABLET | Freq: Two times a day (BID) | ORAL | Status: DC
  Administered 2012-06-23 – 2012-06-27 (×9): 25 mg via ORAL
  Filled 2012-06-23 (×10): qty 1

## 2012-06-23 MED ORDER — PAROXETINE HCL 10 MG PO TABS
10.0000 mg | ORAL_TABLET | Freq: Every day | ORAL | Status: DC
  Administered 2012-06-23 – 2012-06-27 (×5): 10 mg via ORAL
  Filled 2012-06-23 (×5): qty 1

## 2012-06-23 MED ORDER — SODIUM CHLORIDE 0.9 % IV SOLN
INTRAVENOUS | Status: AC
  Administered 2012-06-23: 11:00:00 via INTRAVENOUS

## 2012-06-23 MED ORDER — ACETAMINOPHEN 650 MG RE SUPP: 650.0000 mg | Freq: Four times a day (QID) | RECTAL | Status: DC | PRN

## 2012-06-23 MED ORDER — LEVOFLOXACIN IN D5W 500 MG/100ML IV SOLN
500.0000 mg | Freq: Once | INTRAVENOUS | Status: AC
  Administered 2012-06-23: 500 mg via INTRAVENOUS
  Filled 2012-06-23: qty 100

## 2012-06-23 MED ORDER — SODIUM CHLORIDE 0.9 % IV SOLN
INTRAVENOUS | Status: AC
  Administered 2012-06-23: 04:00:00 via INTRAVENOUS

## 2012-06-23 MED ORDER — PIPERACILLIN-TAZOBACTAM 3.375 G IVPB
3.3750 g | Freq: Three times a day (TID) | INTRAVENOUS | Status: DC
  Administered 2012-06-23 – 2012-06-24 (×5): 3.375 g via INTRAVENOUS
  Filled 2012-06-23 (×7): qty 50

## 2012-06-23 MED ORDER — SODIUM CHLORIDE 0.9 % IJ SOLN
3.0000 mL | Freq: Two times a day (BID) | INTRAMUSCULAR | Status: DC
  Administered 2012-06-23 – 2012-06-25 (×5): 3 mL via INTRAVENOUS

## 2012-06-23 MED ORDER — ACETAMINOPHEN 325 MG PO TABS: 650.0000 mg | ORAL_TABLET | Freq: Four times a day (QID) | ORAL | Status: DC | PRN

## 2012-06-23 MED ORDER — ONDANSETRON HCL 4 MG PO TABS: 4.0000 mg | ORAL_TABLET | Freq: Four times a day (QID) | ORAL | Status: DC | PRN

## 2012-06-23 MED ORDER — PANTOPRAZOLE SODIUM 40 MG IV SOLR
40.0000 mg | INTRAVENOUS | Status: DC
  Administered 2012-06-23 – 2012-06-25 (×3): 40 mg via INTRAVENOUS
  Filled 2012-06-23 (×5): qty 40

## 2012-06-23 MED ORDER — VITAMIN B-12 1000 MCG PO TABS
1000.0000 ug | ORAL_TABLET | Freq: Every day | ORAL | Status: DC
  Administered 2012-06-23 – 2012-06-27 (×5): 1000 ug via ORAL
  Filled 2012-06-23 (×5): qty 1

## 2012-06-23 MED ORDER — TAMSULOSIN HCL 0.4 MG PO CAPS
0.4000 mg | ORAL_CAPSULE | Freq: Every day | ORAL | Status: DC
  Administered 2012-06-23 – 2012-06-26 (×4): 0.4 mg via ORAL
  Filled 2012-06-23 (×5): qty 1

## 2012-06-23 NOTE — Evaluation (Addendum)
Clinical/Bedside Swallow Evaluation Patient Details  Name: Clifford Spencer MRN: [id number] Date of Birth: 1926-01-02  Today's Date: 06/23/2012 Time: 1230-1245 SLP Time Calculation (min): 15 min  Past Medical History:  Past Medical History  Diagnosis Date  . Coronary artery disease   . UTI (lower urinary tract infection)   . GERD (gastroesophageal reflux disease)   . Arthritis   . Dementia   . Hypertension    Past Surgical History:  Past Surgical History  Procedure Laterality Date  . Back surgery    . Cardiac surgery    . Coronary artery bypass graft     HPI:   TAKEEM Spencer is a 79 y.o. male with known history of CAD status post CABG, mild dementia, hypertension, hyperlipidemia intolerant to statins and history of hematuria with history of bladder calculi was found to be confused last evening by patient's family and patient also had hematuria. Patient was brought to the ER and while waiting had one episode of nausea vomiting and was dark-colored vomitus. Patient in the ER was found to be febrile with temperatures running around 104F with lactic acid levels around 6. Patient was started on empiric antibiotics for sepsis most likely secondary to UTI but later chest x-ray showed infiltrates. CXR showed suspected LLL PNA. Initially care was consulted by ER physician and they have advised to give fluids and recheck lactic acid levels which improved with 2 L normal saline bolus. Presently patient mental status back to baseline fever is improved to 101F.   Assessment / Plan / Recommendation Clinical Impression  Patient presents with what appears to be a functional oropharyngeal swallow without evidence of aspiration based on bedside evaluation. Oral phase, timing of swallow initiation, and vocal quality all remains WFL. Patient does not endorse a h/o swallowing difficulty. Note 2 MBSS complete in October of 2011, both recommending NPO due to severe dysphagia.  Discussed with patient and step  son who do not report any h/o significant difficulty. Suspect that patient previously had an acute reversible dysphagia from acute hospitalization which has now resolved. Recommend continuation of a regular diet with general safe swallowing precautions which patient was educated on by this SLP. No further SLP needs indicated at this time, MD, in light of acute PNA and hx, if feel necessary to r/o silent aspiration, please order MBS. Otherwise, no acute needs indicated at this time from SLP standpoint. Signing off.     Aspiration Risk  Mild    Diet Recommendation Regular;Thin liquid   Liquid Administration via: Cup;Straw Medication Administration: Whole meds with liquid Supervision: Patient able to self feed;Intermittent supervision to cue for compensatory strategies Compensations: Slow rate;Small sips/bites Postural Changes and/or Swallow Maneuvers: Seated upright 90 degrees    Other  Recommendations Oral Care Recommendations: Oral care BID   Follow Up Recommendations  None       Pertinent Vitals/Pain None reported        Swallow Study    General HPI:  Clifford Spencer is a 79 y.o. male with known history of CAD status post CABG, mild dementia, hypertension, hyperlipidemia intolerant to statins and history of hematuria with history of bladder calculi was found to be confused last evening by patient's family and patient also had hematuria. Patient was brought to the ER and while waiting had one episode of nausea vomiting and was dark-colored vomitus. Patient in the ER was found to be febrile with temperatures running around 104F with lactic acid levels around 6. Patient was started on empiric  antibiotics for sepsis most likely secondary to UTI but later chest x-ray showed infiltrates. CXR showed suspected LLL PNA. Initially care was consulted by ER physician and they have advised to give fluids and recheck lactic acid levels which improved with 2 L normal saline bolus. Presently patient  mental status back to baseline fever is improved to 101F. Type of Study: Bedside swallow evaluation Previous Swallow Assessment: none  Diet Prior to this Study: Regular;Thin liquids Temperature Spikes Noted: No Respiratory Status: Supplemental O2 delivered via (comment) (nasal cannula) History of Recent Intubation: No Behavior/Cognition: Alert Oral Cavity - Dentition: Dentures, top;Dentures, bottom Self-Feeding Abilities: Able to feed self Patient Positioning: Upright in bed Baseline Vocal Quality: Clear Volitional Cough: Strong Volitional Swallow: Able to elicit    Oral/Motor/Sensory Function Overall Oral Motor/Sensory Function: Appears within functional limits for tasks assessed   Ice Chips Ice chips: Not tested   Thin Liquid Thin Liquid: Within functional limits Presentation: Cup;Self Fed;Straw    Nectar Thick Nectar Thick Liquid: Not tested   Honey Thick Honey Thick Liquid: Not tested   Puree Puree: Within functional limits Presentation: Self Fed;Spoon   Solid   GO   [name], CCC-SLP [phone]  Solid: Within functional limits Presentation: Self Fed       [name] 06/23/2012,12:49 PM

## 2012-06-23 NOTE — Progress Notes (Signed)
Subjective: Patient slightly tremulous. Admission H&P reviewed, chest x-ray worrisome for pneumonia. There was concern also for hematuria, currently Foley in place without any hematuria. Patient's white count has moved from 2.7 to 20. He is on broad-spectrum antibiotics. Family present, they have been updated on patient's progress. There also was noted from the ED in regards to emesis of dark material. There's been no recurrence noted of this.  Objective: Vital signs in last 24 hours: Temp:  [98.1 F (36.7 C)-104.2 F (40.1 C)] 98.1 F (36.7 C) (05/26 0400) Pulse Rate:  [72-96] 88 (05/26 0400) Resp:  [16-26] 18 (05/26 0400) BP: (119-175)/(56-68) 128/61 mmHg (05/26 0400) SpO2:  [90 %-99 %] 98 % (05/26 0400) Weight:  [71 kg (156 lb 8.4 oz)] 71 kg (156 lb 8.4 oz) (05/26 0400) Weight change:  Last BM Date: 06/23/12  Intake/Output from previous day: 05/25 0701 - 05/26 0700 In: -  Out: 900 [Urine:900] Intake/Output this shift:    General appearance: Patient slightly tremulous Resp: Rhonchi bibasilar left greater than right Cardio: regular rate and rhythm, S1, S2 normal, no murmur, click, rub or gallop Extremities: extremities normal, atraumatic, no cyanosis or edema  Lab Results:  Results for orders placed during the hospital encounter of 06/22/12 (from the past 24 hour(s))  CBC WITH DIFFERENTIAL     Status: Abnormal   Collection Time    06/22/12 11:16 PM      Result Value Range   WBC 2.7 (*) 4.0 - 10.5 K/uL   RBC 4.55  4.22 - 5.81 MIL/uL   Hemoglobin 13.1  13.0 - 17.0 g/dL   HCT 45.4  09.8 - 11.9 %   MCV 91.0  78.0 - 100.0 fL   MCH 28.8  26.0 - 34.0 pg   MCHC 31.6  30.0 - 36.0 g/dL   RDW 14.7  82.9 - 56.2 %   Platelets 177  150 - 400 K/uL   Neutrophils Relative % 83 (*) 43 - 77 %   Neutro Abs 2.2  1.7 - 7.7 K/uL   Lymphocytes Relative 15  12 - 46 %   Lymphs Abs 0.4 (*) 0.7 - 4.0 K/uL   Monocytes Relative 1 (*) 3 - 12 %   Monocytes Absolute 0.0 (*) 0.1 - 1.0 K/uL   Eosinophils Relative 2  0 - 5 %   Eosinophils Absolute 0.0  0.0 - 0.7 K/uL   Basophils Relative 0  0 - 1 %   Basophils Absolute 0.0  0.0 - 0.1 K/uL  COMPREHENSIVE METABOLIC PANEL     Status: Abnormal   Collection Time    06/22/12 11:16 PM      Result Value Range   Sodium 140  135 - 145 mEq/L   Potassium 3.6  3.5 - 5.1 mEq/L   Chloride 100  96 - 112 mEq/L   CO2 21  19 - 32 mEq/L   Glucose, Bld 146 (*) 70 - 99 mg/dL   BUN 23  6 - 23 mg/dL   Creatinine, Ser 1.30  0.50 - 1.35 mg/dL   Calcium 9.0  8.4 - 86.5 mg/dL   Total Protein 7.8  6.0 - 8.3 g/dL   Albumin 3.3 (*) 3.5 - 5.2 g/dL   AST 18  0 - 37 U/L   ALT 15  0 - 53 U/L   Alkaline Phosphatase 139 (*) 39 - 117 U/L   Total Bilirubin 0.2 (*) 0.3 - 1.2 mg/dL   GFR calc non Af Amer 52 (*) >90 mL/min  GFR calc Af Amer 60 (*) >90 mL/min  TROPONIN I     Status: None   Collection Time    06/22/12 11:16 PM      Result Value Range   Troponin I <0.30  <0.30 ng/mL  URINALYSIS, ROUTINE W REFLEX MICROSCOPIC     Status: Abnormal   Collection Time    06/22/12 11:20 PM      Result Value Range   Color, Urine YELLOW  YELLOW   APPearance CLOUDY (*) CLEAR   Specific Gravity, Urine 1.013  1.005 - 1.030   pH 5.5  5.0 - 8.0   Glucose, UA NEGATIVE  NEGATIVE mg/dL   Hgb urine dipstick LARGE (*) NEGATIVE   Bilirubin Urine NEGATIVE  NEGATIVE   Ketones, ur NEGATIVE  NEGATIVE mg/dL   Protein, ur 30 (*) NEGATIVE mg/dL   Urobilinogen, UA 0.2  0.0 - 1.0 mg/dL   Nitrite NEGATIVE  NEGATIVE   Leukocytes, UA SMALL (*) NEGATIVE  URINE MICROSCOPIC-ADD ON     Status: None   Collection Time    06/22/12 11:20 PM      Result Value Range   WBC, UA 7-10  <3 WBC/hpf   RBC / HPF 11-20  <3 RBC/hpf  CG4 I-STAT (LACTIC ACID)     Status: Abnormal   Collection Time    06/22/12 11:29 PM      Result Value Range   Lactic Acid, Venous 6.11 (*) 0.5 - 2.2 mmol/L  PROTIME-INR     Status: None   Collection Time    06/22/12 11:35 PM      Result Value Range    Prothrombin Time 12.5  11.6 - 15.2 seconds   INR 0.94  0.00 - 1.49  PROCALCITONIN     Status: None   Collection Time    06/22/12 11:36 PM      Result Value Range   Procalcitonin 0.15    CG4 I-STAT (LACTIC ACID)     Status: Abnormal   Collection Time    06/23/12  1:14 AM      Result Value Range   Lactic Acid, Venous 2.55 (*) 0.5 - 2.2 mmol/L  COMPREHENSIVE METABOLIC PANEL     Status: Abnormal   Collection Time    06/23/12  5:14 AM      Result Value Range   Sodium 142  135 - 145 mEq/L   Potassium 3.4 (*) 3.5 - 5.1 mEq/L   Chloride 107  96 - 112 mEq/L   CO2 24  19 - 32 mEq/L   Glucose, Bld 153 (*) 70 - 99 mg/dL   BUN 20  6 - 23 mg/dL   Creatinine, Ser 4.09  0.50 - 1.35 mg/dL   Calcium 8.2 (*) 8.4 - 10.5 mg/dL   Total Protein 6.5  6.0 - 8.3 g/dL   Albumin 2.7 (*) 3.5 - 5.2 g/dL   AST 16  0 - 37 U/L   ALT 10  0 - 53 U/L   Alkaline Phosphatase 89  39 - 117 U/L   Total Bilirubin 0.4  0.3 - 1.2 mg/dL   GFR calc non Af Amer 52 (*) >90 mL/min   GFR calc Af Amer 60 (*) >90 mL/min  CBC WITH DIFFERENTIAL     Status: Abnormal   Collection Time    06/23/12  5:14 AM      Result Value Range   WBC 20.9 (*) 4.0 - 10.5 K/uL   RBC 4.04 (*) 4.22 - 5.81 MIL/uL   Hemoglobin  11.7 (*) 13.0 - 17.0 g/dL   HCT 40.9 (*) 81.1 - 91.4 %   MCV 90.3  78.0 - 100.0 fL   MCH 29.0  26.0 - 34.0 pg   MCHC 32.1  30.0 - 36.0 g/dL   RDW 78.2  95.6 - 21.3 %   Platelets 158  150 - 400 K/uL   Neutrophils Relative % 93 (*) 43 - 77 %   Neutro Abs 19.4 (*) 1.7 - 7.7 K/uL   Lymphocytes Relative 2 (*) 12 - 46 %   Lymphs Abs 0.3 (*) 0.7 - 4.0 K/uL   Monocytes Relative 5  3 - 12 %   Monocytes Absolute 1.1 (*) 0.1 - 1.0 K/uL   Eosinophils Relative 0  0 - 5 %   Eosinophils Absolute 0.0  0.0 - 0.7 K/uL   Basophils Relative 0  0 - 1 %   Basophils Absolute 0.0  0.0 - 0.1 K/uL  LACTIC ACID, PLASMA     Status: Abnormal   Collection Time    06/23/12  5:14 AM      Result Value Range   Lactic Acid, Venous 2.9 (*) 0.5 -  2.2 mmol/L      Studies/Results: Dg Chest 2 View  06/23/2012   *RADIOLOGY REPORT*  Clinical Data: Altered mental status  CHEST - 2 VIEW  Comparison: 11/13/2010  Findings: Sternotomy wires overlie normal cardiac silhouette. There is mild air space disease in the left lower lobe. Remote posterior left seventh rib fracture.  IMPRESSION: Concern for left lower lobe pneumonia.   Original Report Authenticated By: Genevive Bi, M.D.    Medications:  Prior to Admission:  No prescriptions prior to admission   Scheduled: . sodium chloride   Intravenous STAT  . [START ON 06/25/2012] levofloxacin (LEVAQUIN) IV  750 mg Intravenous Q48H  . metoprolol tartrate  25 mg Oral BID  . PARoxetine  10 mg Oral Daily  . sodium chloride  3 mL Intravenous Q12H  . tamsulosin  0.4 mg Oral QPC supper  . vitamin B-12  1,000 mcg Oral Daily   Continuous: . sodium chloride      Assessment/Plan: Sepsis, probably from pneumonia. There was also note of questionable dark emesis. Patient without any abdominal pain no distention. Continue broad-spectrum antibiotics, IV fluids, check followup x-ray. Pan culture Report of hematuria, currently Foley in place with clear urine.  LOS: 1 day   Ramonte Mena D 06/23/2012, 10:45 AM

## 2012-06-23 NOTE — Progress Notes (Signed)
ANTIBIOTIC CONSULT NOTE - INITIAL  Pharmacy Consult for Levaquin Indication: Pneumonia and UTI  No Known Allergies  Patient Measurements: Height: 5\' 6"  (167.6 cm) Weight: 156 lb 8.4 oz (71 kg) IBW/kg (Calculated) : 63.8  Vital Signs: Temp: 98.1 F (36.7 C) (05/26 0400) Temp src: Oral (05/26 0400) BP: 128/61 mmHg (05/26 0400) Pulse Rate: 88 (05/26 0400) Intake/Output from previous day:   Intake/Output from this shift:    Labs:  Recent Labs  06/22/12 2316  WBC 2.7*  HGB 13.1  PLT 177  CREATININE 1.23   Estimated Creatinine Clearance: 40.3 ml/min (by C-G formula based on Cr of 1.23). No results found for this basename: VANCOTROUGH, VANCOPEAK, VANCORANDOM, GENTTROUGH, GENTPEAK, GENTRANDOM, TOBRATROUGH, TOBRAPEAK, TOBRARND, AMIKACINPEAK, AMIKACINTROU, AMIKACIN,  in the last 72 hours   Microbiology: No results found for this or any previous visit (from the past 720 hour(s)).  Medical History: Past Medical History  Diagnosis Date  . Coronary artery disease   . UTI (lower urinary tract infection)   . GERD (gastroesophageal reflux disease)   . Arthritis   . Dementia   . Hypertension     Medications:  Scheduled:  . sodium chloride   Intravenous STAT  . metoprolol tartrate  25 mg Oral BID  . PARoxetine  10 mg Oral Daily  . sodium chloride  3 mL Intravenous Q12H  . tamsulosin  0.4 mg Oral QPC supper  . vitamin B-12  1,000 mcg Oral Daily   Infusions:  . sodium chloride     Assessment:  79 yr old male with complaint of confusion and hematuria  Febrile (Tm = 104) empirically in ED given Zosyn 3.375gm (5/25 @ 23:52), Vanc 1gm (5/26 @ 00:47) and Levaquin 750mg  (5/26 @ 02:12) for suspected sepsis  Chest Xray reveals PNA  Obtain blood and urine cultures  Upon admission, Levaquin per pharmacy to be continued for PNA (CAP) and UTI  Goal of Therapy:  Eradication of infection  Plan:  Measure antibiotic drug levels at steady state Follow up culture  results Levaquin 750mg  IV q48h  Katianne Barre, Joselyn Glassman, PharmD 06/23/2012,4:24 AM

## 2012-06-23 NOTE — ED Notes (Signed)
pts family Kathlene November [phone] made aware of pts bed placement

## 2012-06-23 NOTE — Progress Notes (Signed)
ANTIBIOTIC CONSULT NOTE - INITIAL  Pharmacy Consult for Zosyn Indication: PNA, sepsis, leukocytosis  No Known Allergies  Patient Measurements: Height: 5\' 6"  (167.6 cm) Weight: 156 lb 8.4 oz (71 kg) IBW/kg (Calculated) : 63.8 Adjusted Body Weight:   Vital Signs: Temp: 98.1 F (36.7 C) (05/26 0400) Temp src: Oral (05/26 0400) BP: 128/61 mmHg (05/26 0400) Pulse Rate: 88 (05/26 0400) Intake/Output from previous day: 05/25 0701 - 05/26 0700 In: -  Out: 900 [Urine:900] Intake/Output from this shift:    Labs:  Recent Labs  06/22/12 2316 06/23/12 0514  WBC 2.7* 20.9*  HGB 13.1 11.7*  PLT 177 158  CREATININE 1.23 1.24   Estimated Creatinine Clearance: 40 ml/min (by C-G formula based on Cr of 1.24). No results found for this basename: VANCOTROUGH, VANCOPEAK, VANCORANDOM, GENTTROUGH, GENTPEAK, GENTRANDOM, TOBRATROUGH, TOBRAPEAK, TOBRARND, AMIKACINPEAK, AMIKACINTROU, AMIKACIN,  in the last 72 hours   Microbiology: No results found for this or any previous visit (from the past 720 hour(s)).  Medical History: Past Medical History  Diagnosis Date  . Coronary artery disease   . UTI (lower urinary tract infection)   . GERD (gastroesophageal reflux disease)   . Arthritis   . Dementia   . Hypertension     Assessment: 64 yoM admitted 5/25 for AMS, hematuria, started on IV antibiotics for PNA, sepsis.  PMHx pertinent for CAD s/p CABG, HTN, HLD, hx hematuria and bladder calculi. Today, pt's WBC rose from 2.7 to 20 and pharmacy consulted to dose Zosyn in addition to levaquin.   D#1 Antibiotics 5/26 >> Vancomycin x 1 5/25 >> Zosyn >> 5/26 >> Levaquin >>   Tm24h: 104.2  WBC 20.9  SCr 1.24, CrCl 26ml/min   5/25 Urine, blood cultures collected   Plan:  1.  Zosyn 3.375g IV q8h (infuse over 4 hours) 2.  Continue Levaquin 750mg  IV q 48 hours 3.  F/u renal fxn, WBC, Tm, cultures, clinical course  Haynes Hoehn, PharmD 06/23/2012 11:07 AM  Pager: [phone]

## 2012-06-23 NOTE — H&P (Signed)
[facility] History and Physical  Clifford Spencer ZOX:[id number] DOB: 01-15-1926 DOA: 06/22/2012  Referring physician: ER physician. PCP: No primary provider on file. Dr. Marden Noble.  Chief Complaint: Confusion and hematuria.  History obtained from patient's son and the ER physician.  HPI: Clifford Spencer is a 79 y.o. male with known history of CAD status post CABG, mild dementia, hypertension, hyperlipidemia intolerant to statins and history of hematuria with history of bladder calculi was found to be confused last evening by patient's family and patient also had hematuria. Patient was brought to the ER and while waiting had one episode of nausea vomiting and was dark-colored vomitus. Patient in the ER was found to be febrile with temperatures running around 104F with lactic acid levels around 6. Patient was started on empiric antibiotics for sepsis most likely secondary to UTI but later chest x-ray showed infiltrates. Initially care was consulted by ER physician and they have advised to give fluids and recheck lactic acid levels which improved with 2 L normal saline bolus. Presently patient mental status back to baseline fever is improved to 101F. Patient will be admitted for further management. Patient denies any abdominal pain diarrhea chest pain. He states he's been having some productive cough since afternoon.  Review of Systems: As presented in the history of presenting illness, rest negative.  Past Medical History  Diagnosis Date  . Coronary artery disease   . UTI (lower urinary tract infection)   . GERD (gastroesophageal reflux disease)   . Arthritis   . Dementia   . Hypertension    Past Surgical History  Procedure Laterality Date  . Back surgery    . Cardiac surgery    . Coronary artery bypass graft     Social History:  reports that he has never smoked. He does not have any smokeless tobacco history on file. He reports that he does not drink alcohol or use illicit  drugs. Lives at home. where does patient live-- Can do ADLs. Can patient participate in ADLs?  No Known Allergies  Family History  Problem Relation Age of Onset  . Breast cancer Mother       Prior to Admission medications   Not on File   Physical Exam: Filed Vitals:   06/23/12 0031 06/23/12 0040 06/23/12 0100 06/23/12 0130  BP: 132/60  134/62 123/56  Pulse:  96 80   Temp: 102.5 F (39.2 C) 102.2 F (39 C) 101.4 F (38.6 C) 101.4 F (38.6 C)  TempSrc:      Resp: 23 22 19 20   SpO2:  97% 99%      General:  Well-developed well-nourished.  Eyes: Anicteric no pallor.  ENT: No discharge from ears eyes nose mouth.  Neck: No mass felt.  Cardiovascular: S1-S2 heard.  Respiratory: No rhonchi or crepitations.  Abdomen: Soft nontender bowel sounds present.  Skin: No rash.  Musculoskeletal: No edema.  Psychiatric: Patient is alert oriented to his name and place.  Neurologic: Follows commands moves all extremities.  Labs on Admission:  Basic Metabolic Panel:  Recent Labs Lab 06/22/12 2316  NA 140  K 3.6  CL 100  CO2 21  GLUCOSE 146*  BUN 23  CREATININE 1.23  CALCIUM 9.0   Liver Function Tests:  Recent Labs Lab 06/22/12 2316  AST 18  ALT 15  ALKPHOS 139*  BILITOT 0.2*  PROT 7.8  ALBUMIN 3.3*   No results found for this basename: LIPASE, AMYLASE,  in the last 168 hours No results found  for this basename: AMMONIA,  in the last 168 hours CBC:  Recent Labs Lab 06/22/12 2316  WBC 2.7*  NEUTROABS 2.2  HGB 13.1  HCT 41.4  MCV 91.0  PLT 177   Cardiac Enzymes:  Recent Labs Lab 06/22/12 2316  TROPONINI <0.30    BNP (last 3 results) No results found for this basename: PROBNP,  in the last 8760 hours CBG: No results found for this basename: GLUCAP,  in the last 168 hours  Radiological Exams on Admission: Dg Chest 2 View  06/23/2012   *RADIOLOGY REPORT*  Clinical Data: Altered mental status  CHEST - 2 VIEW  Comparison: 11/13/2010   Findings: Sternotomy wires overlie normal cardiac silhouette. There is mild air space disease in the left lower lobe. Remote posterior left seventh rib fracture.  IMPRESSION: Concern for left lower lobe pneumonia.   Original Report Authenticated By: Genevive Bi, M.D.    EKG: Independently reviewed. Normal sinus rhythm.  Assessment/Plan Principal Problem:   Community acquired pneumonia Active Problems:   UTI (lower urinary tract infection)   CAD (coronary artery disease)   Acute encephalopathy   Hematuria   BPH (benign prostatic hyperplasia)   1. Community-acquired pneumonia and UTI - patient has been placed on Levaquin. Follow blood cultures and urine cultures. Recheck lactic acid levels in a.m. after hydration. 2. Acute encephalopathy probably secondary to fever which has improved at this time - closely observe. 3. Hematuria - probably secondary to UTI. Patient does have history of recurrent hematuria which has to be followed with urologist. 4. Nausea vomiting - patient did have one episode of dark vomitus. Closely follow CBC. Presently abdomen appears benign. 5. History of CAD status post CABG - due to hematuria we are holding off aspirin. Continue metoprolol. 6. History of dementia and depression - continue present medications.    Code Status: Full code.  Family Communication: Patient's son.  Disposition Plan: Admit to inpatient.    Kimmie Doren N. [facility] Pager [phone].  If 7PM-7AM, please contact night-coverage [email or web address] Password [facility] 06/23/2012, 2:38 AM

## 2012-06-23 NOTE — ED Notes (Signed)
Pt cleaned, linen changed, moderate amount malodorous loose stool noted. Pt now awake, alert.

## 2012-06-24 DIAGNOSIS — N4889 Other specified disorders of penis: Secondary | ICD-10-CM | POA: Clinically undetermined

## 2012-06-24 DIAGNOSIS — E860 Dehydration: Secondary | ICD-10-CM | POA: Diagnosis present

## 2012-06-24 DIAGNOSIS — Z66 Do not resuscitate: Secondary | ICD-10-CM | POA: Diagnosis present

## 2012-06-24 LAB — CBC
HCT: 32.4 % — ABNORMAL LOW (ref 39.0–52.0)
Hemoglobin: 10.8 g/dL — ABNORMAL LOW (ref 13.0–17.0)
MCH: 30 pg (ref 26.0–34.0)
MCHC: 33.3 g/dL (ref 30.0–36.0)
RDW: 13.8 % (ref 11.5–15.5)

## 2012-06-24 LAB — BASIC METABOLIC PANEL
BUN: 19 mg/dL (ref 6–23)
Creatinine, Ser: 1.4 mg/dL — ABNORMAL HIGH (ref 0.50–1.35)
GFR calc non Af Amer: 45 mL/min — ABNORMAL LOW (ref >90)
Glucose, Bld: 113 mg/dL — ABNORMAL HIGH (ref 70–99)
Potassium: 3.7 mEq/L (ref 3.5–5.1)

## 2012-06-24 MED ORDER — POTASSIUM CHLORIDE IN NACL 20-0.45 MEQ/L-% IV SOLN
INTRAVENOUS | Status: DC
  Administered 2012-06-24: 09:00:00 via INTRAVENOUS
  Filled 2012-06-24 (×3): qty 1000

## 2012-06-24 MED ORDER — LIDOCAINE VISCOUS 2 % MT SOLN
5.0000 mL | OROMUCOSAL | Status: DC | PRN
  Filled 2012-06-24: qty 5

## 2012-06-24 NOTE — Care Management Note (Addendum)
    Page 1 of 2   06/27/2012     12:16:06 PM   CARE MANAGEMENT NOTE 06/27/2012  Patient:  Clifford, Spencer   Account Number:  [id number]  Date Initiated:  06/24/2012  Documentation initiated by:  Clifford Spencer  Subjective/Objective Assessment:   ADMITTED W/PNA.ZO:XWRU DEMENTIA.     Action/Plan:   FROM HOME W/SON.HAS CANE,RW.HAS PCP,PHARMACY.   Anticipated DC Date:  06/27/2012   Anticipated DC Plan:  HOME W HOME HEALTH SERVICES      DC Planning Services  CM consult      Choice offered to / List presented to:  C-1 Patient        HH arranged  HH-1 RN  HH-2 PT  HH-3 OT      [facility] agency  [facility]   Status of service:  Completed, signed off Medicare Important Message given?   (If response is "NO", the following Medicare IM given date fields will be blank) Date Medicare IM given:   Date Additional Medicare IM given:    Discharge Disposition:  HOME W HOME HEALTH SERVICES  Per UR Regulation:  Reviewed for med. necessity/level of care/duration of stay  If discussed at Long Length of Stay Meetings, dates discussed:    Comments:  06/27/12 Clifford Hamblen RN,BSN NCM [phone] RECEIVED CALL FROM STE[facility]-Clifford Spencer WHO CHOSE [facility],& INITIALLY DIDN'T WANT ANY HH,BUT ONCE EXPLAINED THAT PATIENT WAS GOING FROM SNF,& NOW HOME-WOULD RECOMMEND HHRN/PT.HE AGREED BUT ONLY WANTED RN/PT,BUT NOT OT.TC Clifford Spencer [facility] REP INFORMED OF D/C & HHORDERS,& ALSO STE[facility]'S NOT WANTING HHOT-HE SAYS HE DIDN'T WANT TOO MANY DIFFERENT PEOPLE IN THE HOUSE.Clifford Spencer CALLED MY DESK TEL# ASKING WHEN THE D/C WOULD TAKE PLACE.PROVIDED HIM W/THE FLOOR TEL# TO CONTACT THE STAFF NURSE WHO WOULD BE ABLE TO TELL HIM PATIENT WOULD BE READY FOR D/C.  06/26/12 Clifford Staron RN,BSN NCM [phone] PER CSW DENIED AUTH FOR SNF.SPOKE TO PATIENT ABOUT HHC/PRIVATE SITTER SERVICES.PER PATIENT PERMISSION TC STEP SON-Clifford [name] Spencer TO #'S LISTED H#[phone]-LEFT MESSAGE W/CALL BACK#.WORK TEL#[phone](NOT A WORKING TEL#).TC DR. GATES TO  EXPLAIN THAT PATIENT DENIED AUTH FOR SNF. MD SAYS THIS IS NOT A SAFE D/C TODAY,SINCE STEPSON NEEDS TO DISCUSS W/FAMILY WHAT OTHER OPTIONS ARE AVAILABLE.RECEIVED ANOTHER TEL# FROM MD FOR [facility]#[phone]-SPOKE TO STEPSON & INFORMED OF SNF DENIAL PER INSURANCE COMPANY.HE HAD MANY QUESTIONS ABOUT WHY DENIED,& WHAT ABOUT ALF,SINCE HE CAN'T GO HOME.EXPLAINED WHAT HHC SERVICES ENTAILED.EXPLAINED THAT I WOULD CONTACT THE SW TO CONTACT HIM,HE RECOMMENDED THAT CSW TALK TO HIS WIFE-Clifford Spencer @C #[phone].LEFT MESSAGE FOR CSW TO CONTACT STEPSON'S WIFE.   06/24/12 Clifford Morales RN,BSN NCM [phone] PT-HH/SNF.CSW FOLLOWING.

## 2012-06-24 NOTE — Evaluation (Signed)
Physical Therapy Evaluation Patient Details Name: Clifford Spencer MRN: [id number] DOB: [birth year] Today's Date: 06/24/2012 Time: 7829-5621 PT Time Calculation (min): 26 min  PT Assessment / Plan / Recommendation Clinical Impression  79 yo male admitted with Pna, confusion. Pt was modified independent PTA. On eval, pt required Min guard assist for mobility-able to ambulate ~250 feet while holding onto IV pole. Pt normally uses cane at all times. Pt does state he has chronic balance disorder. Pt did not appear confused during PT session.-participated well with session. Recommend HHPT vs SNF, depending on progression and family's ability to provide supervision.     PT Assessment  Patient needs continued PT services    Follow Up Recommendations  Home health PT vs SNF (depending on progress/family's ability to provide supervision)    Does the patient have the potential to tolerate intense rehabilitation      Barriers to Discharge        Equipment Recommendations  None recommended by PT    Recommendations for Other Services OT consult   Frequency Min 3X/week    Precautions / Restrictions Precautions Precautions: Fall Precaution Comments: pt states history of balance disorder Restrictions Weight Bearing Restrictions: No   Pertinent Vitals/Pain No c/o pain      Mobility  Bed Mobility Bed Mobility: Supine to Sit Supine to Sit: 6: Modified independent (Device/Increase time) Transfers Transfers: Sit to Stand;Stand to Sit Sit to Stand: 6: Modified independent (Device/Increase time);From bed Stand to Sit: 6: Modified independent (Device/Increase time);To chair/3-in-1 Ambulation/Gait Ambulation/Gait Assistance: 4: Min guard Ambulation Distance (Feet): 250 Feet Assistive device: Other (Comment) (pushed IV pole) Ambulation/Gait Assistance Details: Good gait speed. No LOB.  Gait Pattern: Step-through pattern;Decreased stride length    Exercises     PT Diagnosis: Difficulty  walking  PT Problem List: Decreased mobility PT Treatment Interventions: Gait training;Stair training;Functional mobility training;Therapeutic activities;Therapeutic exercise;Patient/family education   PT Goals Acute Rehab PT Goals PT Goal Formulation: With patient Time For Goal Achievement: 07/08/12 Potential to Achieve Goals: Good Pt will go Sit to Stand: with modified independence PT Goal: Sit to Stand - Progress: Goal set today Pt will Ambulate: with cane;>150 feet;with modified independence PT Goal: Ambulate - Progress: Goal set today Pt will Go Up / Down Stairs: 3-5 stairs;with modified independence;with rail(s);with least restrictive assistive device PT Goal: Up/Down Stairs - Progress: Goal set today Additional Goals Additional Goal #1: Pt will demonstrate good balance and safety awareness during functional mobility tasks without LOB or cueing PT Goal: Additional Goal #1 - Progress: Goal set today  Visit Information  Last PT Received On: 06/24/12 Assistance Needed: +1    Subjective Data  Subjective: Im a nut Patient Stated Goal: home   Prior Functioning  Home Living Lives With: Other (Comment) (stepson, stepson's wsife) Type of Home: House Home Access: Stairs to enter [employer] of Steps: 4 Entrance Stairs-Rails: Right Home Layout: One level Home Adaptive Equipment: Straight cane;Walker - rolling Prior Function Level of Independence: Needs assistance Needs Assistance: Bathing Bath: Minimal Comments: Assist into/out of tub. Ambulates with cane Communication Communication: No difficulties    Cognition  Cognition Arousal/Alertness: Awake/alert Behavior During Therapy: WFL for tasks assessed/performed Overall Cognitive Status: Within Functional Limits for tasks assessed    Extremity/Trunk Assessment Right Lower Extremity Assessment RLE ROM/Strength/Tone: [facility] for tasks assessed Left Lower Extremity Assessment LLE ROM/Strength/Tone: WFL for tasks  assessed Trunk Assessment Trunk Assessment: Normal   Balance Balance Balance Assessed: Yes Static Standing Balance Static Standing - Balance Support: Right upper  extremity supported Static Standing - Level of Assistance: 5: Stand by assistance Dynamic Standing Balance Dynamic Standing - Balance Support: Right upper extremity supported Dynamic Standing - Level of Assistance: 5: Stand by assistance  End of Session PT - End of Session Equipment Utilized During Treatment: Gait belt Activity Tolerance: Patient tolerated treatment well Patient left: in chair;with call bell/phone within reach;with chair alarm set  GP     Rebeca Alert, MPT Pager: [phone]

## 2012-06-24 NOTE — Progress Notes (Signed)
Clinical Social Work Department BRIEF PSYCHOSOCIAL ASSESSMENT 06/24/2012  Patient:  Clifford Spencer, Clifford Spencer     Account Number:  [id number]     Admit date:  06/22/2012  Clinical Social Worker:  Dennison Bulla  Date/Time:  06/24/2012 03:00 PM  Referred by:  Physician  Date Referred:  06/24/2012 Referred for  SNF Placement   Other Referral:   Interview type:  Patient Other interview type:    PSYCHOSOCIAL DATA Living Status:  FAMILY Admitted from facility:   Level of care:   Primary support name:  Kathlene November and Jan Primary support relationship to patient:  CHILD, ADULT Degree of support available:   Strong    CURRENT CONCERNS Current Concerns  Post-Acute Placement   Other Concerns:    SOCIAL WORK ASSESSMENT / PLAN CSW received referral to assist with DC planning. CSW reviewed chart and met with patient at bedside. CSW introduced myself and explained role.    Patient moved in with son and dtr-in-law in 2010/07/05. Patient's wife passed away in 2007-07-05 and he went to SNF at [facility] in July 05, 2010. Patient agreeable to SNF placement but asked CSW to contact family as well.    CSW spoke with dtr-in-law via phone who reports that patient has become more forgetful. Family has spoken to MD about possible LT placement. CSW spoke about SNF being short term and explained that insurance would have to approve. Family reports understanding and prefers [facility] [place]. CSW completed county wide search and will follow up with bed offers.    CSW explained ALF placement if LT placement needed. CSW explained Medicare will not cover ALF and encouraged family to talk with Department of Social Services regarding Medicaid to determine if patient would qualify for services. CSW emailed family SNF and ALF list.    CSW will follow up with bed offers. CSW submitted clinicals to insurance for authorization.   Assessment/plan status:  Psychosocial Support/Ongoing Assessment of Needs Other assessment/ plan:    Information/referral to community resources:   SNF list  ALF list    PATIENT'S/FAMILY'S RESPONSE TO PLAN OF CARE: Patient alert and engaged but reports that family makes decisions. Family receptive to CSW intervention and agreeable to SNF.

## 2012-06-24 NOTE — Progress Notes (Signed)
Subjective: Clifford Spencer was awake when I entered the room this morning and only complaining of some urethral burning.  Otherwise feeling better.  Recognizes me and no status is much improved from reports from yesterday.  With his son-in-law, Kathlene November, who reports increasing confusion as of late.  Tends to leave faucets running, et cetera.  Some of this may be secondary to chronic indolent infection and will just see how he does over the next few days  Objective: Weight change: 0.3 kg (10.6 oz)  Intake/Output Summary (Last 24 hours) at 06/24/12 0711 Last data filed at 06/24/12 0535  Gross per 24 hour  Intake 1684.66 ml  Output   1215 ml  Net 469.66 ml   Filed Vitals:   06/23/12 1300 06/23/12 2154 06/24/12 0525 06/24/12 0534  BP: 120/51 128/56  119/67  Pulse: 67 65  58  Temp: 98.5 F (36.9 C) 98.4 F (36.9 C)  97.4 F (36.3 C)  TempSrc: Oral Oral  Oral  Resp: 18 16  16   Height:      Weight:   71.3 kg (157 lb 3 oz)   SpO2: 98% 97%  95%   General: Elderly male conversive and comfortable except for complaint of urethral burning HEENT: Wears dentures.  Mouth is parched with tongue quite dry and wrinkled Neck:  Supple Lungs: Left greater than right basilar rales on inspiration Cardiac: Regular rhythm without gallop Abdomen: Soft nontender nondistended with normal bowel sounds GU: Penis appears normal in urine in Foley is no longer bloody but slightly cloudy Extremities: Without cyanosis clubbing or edema Neurological: Oriented x1, alert and conversive.  Recognizes me.  Moves all extremities well.  Able to sit up on his own with some difficulty   Lab Results: Results for orders placed during the hospital encounter of 06/22/12 (from the past 48 hour(s))  CBC WITH DIFFERENTIAL     Status: Abnormal   Collection Time    06/22/12 11:16 PM      Result Value Range   WBC 2.7 (*) 4.0 - 10.5 K/uL   RBC 4.55  4.22 - 5.81 MIL/uL   Hemoglobin 13.1  13.0 - 17.0 g/dL   HCT 16.1  09.6 - 04.5 %   MCV 91.0  78.0 - 100.0 fL   MCH 28.8  26.0 - 34.0 pg   MCHC 31.6  30.0 - 36.0 g/dL   RDW 40.9  81.1 - 91.4 %   Platelets 177  150 - 400 K/uL   Neutrophils Relative % 83 (*) 43 - 77 %   Neutro Abs 2.2  1.7 - 7.7 K/uL   Lymphocytes Relative 15  12 - 46 %   Lymphs Abs 0.4 (*) 0.7 - 4.0 K/uL   Monocytes Relative 1 (*) 3 - 12 %   Monocytes Absolute 0.0 (*) 0.1 - 1.0 K/uL   Eosinophils Relative 2  0 - 5 %   Eosinophils Absolute 0.0  0.0 - 0.7 K/uL   Basophils Relative 0  0 - 1 %   Basophils Absolute 0.0  0.0 - 0.1 K/uL  COMPREHENSIVE METABOLIC PANEL     Status: Abnormal   Collection Time    06/22/12 11:16 PM      Result Value Range   Sodium 140  135 - 145 mEq/L   Potassium 3.6  3.5 - 5.1 mEq/L   Chloride 100  96 - 112 mEq/L   CO2 21  19 - 32 mEq/L   Glucose, Bld 146 (*) 70 - 99 mg/dL  BUN 23  6 - 23 mg/dL   Creatinine, Ser 8.11  0.50 - 1.35 mg/dL   Calcium 9.0  8.4 - 91.4 mg/dL   Total Protein 7.8  6.0 - 8.3 g/dL   Albumin 3.3 (*) 3.5 - 5.2 g/dL   AST 18  0 - 37 U/L   ALT 15  0 - 53 U/L   Alkaline Phosphatase 139 (*) 39 - 117 U/L   Total Bilirubin 0.2 (*) 0.3 - 1.2 mg/dL   GFR calc non Af Amer 52 (*) >90 mL/min   GFR calc Af Amer 60 (*) >90 mL/min   Comment:            The eGFR has been calculated     using the CKD EPI equation.     This calculation has not been     validated in all clinical     situations.     eGFR's persistently     <90 mL/min signify     possible Chronic Kidney Disease.  TROPONIN I     Status: None   Collection Time    06/22/12 11:16 PM      Result Value Range   Troponin I <0.30  <0.30 ng/mL   Comment:            Due to the release kinetics of cTnI,     a negative result within the first hours     of the onset of symptoms does not rule out     myocardial infarction with certainty.     If myocardial infarction is still suspected,     repeat the test at appropriate intervals.  URINALYSIS, ROUTINE W REFLEX MICROSCOPIC     Status: Abnormal    Collection Time    06/22/12 11:20 PM      Result Value Range   Color, Urine YELLOW  YELLOW   APPearance CLOUDY (*) CLEAR   Specific Gravity, Urine 1.013  1.005 - 1.030   pH 5.5  5.0 - 8.0   Glucose, UA NEGATIVE  NEGATIVE mg/dL   Hgb urine dipstick LARGE (*) NEGATIVE   Bilirubin Urine NEGATIVE  NEGATIVE   Ketones, ur NEGATIVE  NEGATIVE mg/dL   Protein, ur 30 (*) NEGATIVE mg/dL   Urobilinogen, UA 0.2  0.0 - 1.0 mg/dL   Nitrite NEGATIVE  NEGATIVE   Leukocytes, UA SMALL (*) NEGATIVE  URINE CULTURE     Status: None   Collection Time    06/22/12 11:20 PM      Result Value Range   Specimen Description URINE, CATHETERIZED     Special Requests NONE     Culture  Setup Time 06/23/2012 14:15     Colony Count >=100,000 COLONIES/ML     Culture GRAM NEGATIVE RODS     Report Status PENDING    URINE MICROSCOPIC-ADD ON     Status: None   Collection Time    06/22/12 11:20 PM      Result Value Range   WBC, UA 7-10  <3 WBC/hpf   RBC / HPF 11-20  <3 RBC/hpf  CG4 I-STAT (LACTIC ACID)     Status: Abnormal   Collection Time    06/22/12 11:29 PM      Result Value Range   Lactic Acid, Venous 6.11 (*) 0.5 - 2.2 mmol/L  PROTIME-INR     Status: None   Collection Time    06/22/12 11:35 PM      Result Value Range   Prothrombin Time 12.5  11.6 -  15.2 seconds   INR 0.94  0.00 - 1.49  PROCALCITONIN     Status: None   Collection Time    06/22/12 11:36 PM      Result Value Range   Procalcitonin 0.15     Comment:            Interpretation:     PCT (Procalcitonin) <= 0.5 ng/mL:     Systemic infection (sepsis) is not likely.     Local bacterial infection is possible.     (NOTE)             ICU PCT Algorithm               Non ICU PCT Algorithm        ----------------------------     ------------------------------             PCT < 0.25 ng/mL                 PCT < 0.1 ng/mL         Stopping of antibiotics            Stopping of antibiotics           strongly encouraged.               strongly  encouraged.        ----------------------------     ------------------------------           PCT level decrease by               PCT < 0.25 ng/mL           >= 80% from peak PCT           OR PCT 0.25 - 0.5 ng/mL          Stopping of antibiotics                                                 encouraged.         Stopping of antibiotics               encouraged.        ----------------------------     ------------------------------           PCT level decrease by              PCT >= 0.25 ng/mL           < 80% from peak PCT            AND PCT >= 0.5 ng/mL            Continuing antibiotics                                                  encouraged.           Continuing antibiotics                encouraged.        ----------------------------     ------------------------------         PCT level increase compared          PCT > 0.5 ng/mL  with peak PCT AND              PCT >= 0.5 ng/mL             Escalation of antibiotics                                              strongly encouraged.          Escalation of antibiotics            strongly encouraged.  CG4 I-STAT (LACTIC ACID)     Status: Abnormal   Collection Time    06/23/12  1:14 AM      Result Value Range   Lactic Acid, Venous 2.55 (*) 0.5 - 2.2 mmol/L  COMPREHENSIVE METABOLIC PANEL     Status: Abnormal   Collection Time    06/23/12  5:14 AM      Result Value Range   Sodium 142  135 - 145 mEq/L   Potassium 3.4 (*) 3.5 - 5.1 mEq/L   Chloride 107  96 - 112 mEq/L   CO2 24  19 - 32 mEq/L   Glucose, Bld 153 (*) 70 - 99 mg/dL   BUN 20  6 - 23 mg/dL   Creatinine, Ser 1.61  0.50 - 1.35 mg/dL   Calcium 8.2 (*) 8.4 - 10.5 mg/dL   Total Protein 6.5  6.0 - 8.3 g/dL   Albumin 2.7 (*) 3.5 - 5.2 g/dL   AST 16  0 - 37 U/L   ALT 10  0 - 53 U/L   Alkaline Phosphatase 89  39 - 117 U/L   Total Bilirubin 0.4  0.3 - 1.2 mg/dL   GFR calc non Af Amer 52 (*) >90 mL/min   GFR calc Af Amer 60 (*) >90 mL/min   Comment:            The  eGFR has been calculated     using the CKD EPI equation.     This calculation has not been     validated in all clinical     situations.     eGFR's persistently     <90 mL/min signify     possible Chronic Kidney Disease.  CBC WITH DIFFERENTIAL     Status: Abnormal   Collection Time    06/23/12  5:14 AM      Result Value Range   WBC 20.9 (*) 4.0 - 10.5 K/uL   RBC 4.04 (*) 4.22 - 5.81 MIL/uL   Hemoglobin 11.7 (*) 13.0 - 17.0 g/dL   HCT 09.6 (*) 04.5 - 40.9 %   MCV 90.3  78.0 - 100.0 fL   MCH 29.0  26.0 - 34.0 pg   MCHC 32.1  30.0 - 36.0 g/dL   RDW 81.1  91.4 - 78.2 %   Platelets 158  150 - 400 K/uL   Neutrophils Relative % 93 (*) 43 - 77 %   Neutro Abs 19.4 (*) 1.7 - 7.7 K/uL   Lymphocytes Relative 2 (*) 12 - 46 %   Lymphs Abs 0.3 (*) 0.7 - 4.0 K/uL   Monocytes Relative 5  3 - 12 %   Monocytes Absolute 1.1 (*) 0.1 - 1.0 K/uL   Eosinophils Relative 0  0 - 5 %   Eosinophils Absolute 0.0  0.0 - 0.7 K/uL   Basophils Relative 0  0 -  1 %   Basophils Absolute 0.0  0.0 - 0.1 K/uL  LACTIC ACID, PLASMA     Status: Abnormal   Collection Time    06/23/12  5:14 AM      Result Value Range   Lactic Acid, Venous 2.9 (*) 0.5 - 2.2 mmol/L  BASIC METABOLIC PANEL     Status: Abnormal   Collection Time    06/24/12  4:25 AM      Result Value Range   Sodium 136  135 - 145 mEq/L   Potassium 3.7  3.5 - 5.1 mEq/L   Chloride 105  96 - 112 mEq/L   CO2 26  19 - 32 mEq/L   Glucose, Bld 113 (*) 70 - 99 mg/dL   BUN 19  6 - 23 mg/dL   Creatinine, Ser 2.95 (*) 0.50 - 1.35 mg/dL   Calcium 8.0 (*) 8.4 - 10.5 mg/dL   GFR calc non Af Amer 45 (*) >90 mL/min   GFR calc Af Amer 52 (*) >90 mL/min   Comment:            The eGFR has been calculated     using the CKD EPI equation.     This calculation has not been     validated in all clinical     situations.     eGFR's persistently     <90 mL/min signify     possible Chronic Kidney Disease.  CBC     Status: Abnormal   Collection Time    06/24/12  4:25  AM      Result Value Range   WBC 18.0 (*) 4.0 - 10.5 K/uL   RBC 3.60 (*) 4.22 - 5.81 MIL/uL   Hemoglobin 10.8 (*) 13.0 - 17.0 g/dL   HCT 62.1 (*) 30.8 - 65.7 %   MCV 90.0  78.0 - 100.0 fL   MCH 30.0  26.0 - 34.0 pg   MCHC 33.3  30.0 - 36.0 g/dL   RDW 84.6  96.2 - 95.2 %   Platelets 141 (*) 150 - 400 K/uL    Studies/Results: Dg Chest 2 View  06/23/2012   *RADIOLOGY REPORT*  Clinical Data: Follow up pneumonia  CHEST - 2 VIEW  Comparison: 06/22/2012  Findings: Chronic interstitial markings.  Mild left lower lobe opacity is possible, more conspicuous on the lateral view.  No pleural effusion or pneumothorax.  Cardiomegaly. Postsurgical changes related to prior CABG.  Degenerative changes of the visualized thoracolumbar spine.  IMPRESSION: Possible mild left lower lobe opacity / pneumonia, unchanged.   Original Report Authenticated By: Charline Bills, M.D.   Dg Chest 2 View  06/23/2012   *RADIOLOGY REPORT*  Clinical Data: Altered mental status  CHEST - 2 VIEW  Comparison: 11/13/2010  Findings: Sternotomy wires overlie normal cardiac silhouette. There is mild air space disease in the left lower lobe. Remote posterior left seventh rib fracture.  IMPRESSION: Concern for left lower lobe pneumonia.   Original Report Authenticated By: Genevive Bi, M.D.   Medications: Scheduled Meds: . [START ON 06/25/2012] levofloxacin (LEVAQUIN) IV  750 mg Intravenous Q48H  . metoprolol tartrate  25 mg Oral BID  . pantoprazole (PROTONIX) IV  40 mg Intravenous Q24H  . PARoxetine  10 mg Oral Daily  . piperacillin-tazobactam (ZOSYN)  IV  3.375 g Intravenous Q8H  . sodium chloride  3 mL Intravenous Q12H  . tamsulosin  0.4 mg Oral QPC supper  . vitamin B-12  1,000 mcg Oral Daily   Continuous Infusions: .  0.45 % NaCl with KCl 20 mEq / L     PRN Meds:.acetaminophen, acetaminophen, lidocaine, ondansetron (ZOFRAN) IV, ondansetron  Assessment/Plan: Principal Problem:   Community acquired pneumonia - continue  Zosyn and Levaquin for now.  White count trending down.  Infiltrate in left lower lobe - repeat chest x-ray tomorrow Active Problems:   UTI (lower urinary tract infection) - greater than 100,000 gram-negative rods, speciation pending.  Continue antibiotics as above   CAD (coronary artery disease) - currently asymptomatic   Acute encephalopathy - mental status improving.  Recognizes me today.  Likely secondary to acute infection which is improving.     Hematuria - grossly resolved   BPH (benign prostatic hyperplasia) - has history of BPH and prostatitis.   Dehydration - creatinine up some today.  Continue IV rehydration and encourage oral fluids   Disposition - spoke with son-in-law this morning, Kathlene November, who reports that he is becoming progressively more confused and may need placement.  Certainly would be reasonable for skilled nursing facility placement for physical therapy and occupational therapy and then possibly transitioning to Assisted Living   No CODE BLUE   LOS: 2 days   Pearla Dubonnet, MD 06/24/2012, 7:11 AM

## 2012-06-24 NOTE — Progress Notes (Signed)
Clinical Social Work Department CLINICAL SOCIAL WORK PLACEMENT NOTE 06/24/2012  Patient:  [name], [name]  Account Number:  [id number] Admit date:  06/22/2012  Clinical Social Worker:  Unk Lightning, LCSW  Date/time:  06/24/2012 03:00 PM  Clinical Social Work is seeking post-discharge placement for this patient at the following level of care:   SKILLED NURSING   (*CSW will update this form in Epic as items are completed)   06/24/2012  Patient/family provided with [facility] Department of Clinical Social Work's list of facilities offering this level of care within the geographic area requested by the patient (or if unable, by the patient's family).  06/24/2012  Patient/family informed of their freedom to choose among providers that offer the needed level of care, that participate in Medicare, Medicaid or managed care program needed by the patient, have an available bed and are willing to accept the patient.  06/24/2012  Patient/family informed of [facility]' ownership interest in [facility], as well as of the fact that they are under no obligation to receive care at this facility.  PASARR submitted to [employer] on existing # PASARR number received from [employer] on   FL2 transmitted to all facilities in geographic area requested by pt/family on  06/24/2012 FL2 transmitted to all facilities within larger geographic area on   Patient informed that his/her managed care company has contracts with or will negotiate with  certain facilities, including the following:     Patient/family informed of bed offers received:   Patient chooses bed at  Physician recommends and patient chooses bed at    Patient to be transferred to  on   Patient to be transferred to facility by   The following physician request were entered in Epic:   Additional Comments:

## 2012-06-25 DIAGNOSIS — R7881 Bacteremia: Secondary | ICD-10-CM | POA: Diagnosis present

## 2012-06-25 DIAGNOSIS — E876 Hypokalemia: Secondary | ICD-10-CM | POA: Diagnosis not present

## 2012-06-25 DIAGNOSIS — R195 Other fecal abnormalities: Secondary | ICD-10-CM | POA: Diagnosis not present

## 2012-06-25 DIAGNOSIS — R4189 Other symptoms and signs involving cognitive functions and awareness: Secondary | ICD-10-CM | POA: Diagnosis present

## 2012-06-25 DIAGNOSIS — R197 Diarrhea, unspecified: Secondary | ICD-10-CM | POA: Diagnosis not present

## 2012-06-25 LAB — CBC WITH DIFFERENTIAL/PLATELET
Eosinophils Relative: 2 % (ref 0–5)
Lymphocytes Relative: 11 % — ABNORMAL LOW (ref 12–46)
Lymphs Abs: 1.5 10*3/uL (ref 0.7–4.0)
MCH: 30.6 pg (ref 26.0–34.0)
MCHC: 34.6 g/dL (ref 30.0–36.0)
Monocytes Absolute: 1 10*3/uL (ref 0.1–1.0)
Monocytes Relative: 7 % (ref 3–12)
Neutro Abs: 11.3 10*3/uL — ABNORMAL HIGH (ref 1.7–7.7)

## 2012-06-25 LAB — URINE CULTURE: Colony Count: >=100000

## 2012-06-25 LAB — BASIC METABOLIC PANEL
BUN: 17 mg/dL (ref 6–23)
Calcium: 8.2 mg/dL — ABNORMAL LOW (ref 8.4–10.5)
GFR calc non Af Amer: 48 mL/min — ABNORMAL LOW (ref >90)
Glucose, Bld: 141 mg/dL — ABNORMAL HIGH (ref 70–99)
Sodium: 138 mEq/L (ref 135–145)

## 2012-06-25 MED ORDER — CEFTRIAXONE SODIUM 1 G IJ SOLR
1.0000 g | INTRAMUSCULAR | Status: DC
  Administered 2012-06-25: 1 g via INTRAVENOUS
  Filled 2012-06-25 (×3): qty 10

## 2012-06-25 MED ORDER — SODIUM CHLORIDE 0.45 % IV SOLN
INTRAVENOUS | Status: DC
  Administered 2012-06-25 – 2012-06-26 (×2): via INTRAVENOUS
  Filled 2012-06-25 (×3): qty 1000

## 2012-06-25 MED ORDER — DIPHENOXYLATE-ATROPINE 2.5-0.025 MG PO TABS
1.0000 | ORAL_TABLET | Freq: Four times a day (QID) | ORAL | Status: DC | PRN
  Administered 2012-06-25: 1 via ORAL
  Filled 2012-06-25: qty 1

## 2012-06-25 MED ORDER — POTASSIUM CHLORIDE CRYS ER 20 MEQ PO TBCR
20.0000 meq | EXTENDED_RELEASE_TABLET | Freq: Two times a day (BID) | ORAL | Status: DC
  Administered 2012-06-25 – 2012-06-27 (×5): 20 meq via ORAL
  Filled 2012-06-25 (×6): qty 1

## 2012-06-25 MED ORDER — SACCHAROMYCES BOULARDII 250 MG PO CAPS
250.0000 mg | ORAL_CAPSULE | Freq: Two times a day (BID) | ORAL | Status: DC
  Administered 2012-06-25 – 2012-06-27 (×5): 250 mg via ORAL
  Filled 2012-06-25 (×6): qty 1

## 2012-06-25 NOTE — Progress Notes (Signed)
06-25-12  0400 Please NOTE Stool + guiac - pt has had 2 loose stools tonight, medium brown liquid.

## 2012-06-25 NOTE — Progress Notes (Signed)
Physical Therapy Treatment Patient Details Name: Clifford Spencer MRN: [id number] DOB: [birth year] Today's Date: 06/25/2012 Time: 0865-7846 PT Time Calculation (min): 27 min  PT Assessment / Plan / Recommendation Comments on Treatment Session  Pt ambulated in hallway with [facility] and reports hx of falls, discussed uing RW for long distance or in community for safety.  Pt also performed standing exercises with therapist providing bil UE support and pt demonstrated a couple exercises he reports he performs every morning from seated exercise program on television.  Pt reports d/c plans for SNF.    Follow Up Recommendations  SNF;Supervision/Assistance - 24 hour (HHPT if home)     Does the patient have the potential to tolerate intense rehabilitation     Barriers to Discharge        Equipment Recommendations  None recommended by PT    Recommendations for Other Services    Frequency     Plan Discharge plan remains appropriate;Frequency remains appropriate    Precautions / Restrictions Precautions Precautions: Fall Precaution Comments: pt states history of balance disorder   Pertinent Vitals/Pain No pain    Mobility  Bed Mobility Bed Mobility: Supine to Sit Supine to Sit: 6: Modified independent (Device/Increase time) Transfers Transfers: Sit to Stand;Stand to Sit Sit to Stand: From bed;5: Supervision Stand to Sit: 6: Modified independent (Device/Increase time);To bed;5: Supervision Ambulation/Gait Ambulation/Gait Assistance: 4: Min guard Ambulation Distance (Feet): 400 Feet Assistive device: Straight cane Ambulation/Gait Assistance Details: pt had one LOB but able to self correct Gait Pattern: Step-through pattern;Decreased stride length;Narrow base of support Gait velocity: decreased    Exercises General Exercises - Upper Extremity Shoulder Flexion: AROM;Both;15 reps;Seated Elbow Flexion: 15 reps;Seated;AROM;Both Elbow Extension: 15 reps;Seated;AROM;Both General Exercises -  Lower Extremity Long Arc Quad: AROM;Both;15 reps;Seated Hip ABduction/ADduction: AROM;Both;15 reps;Standing;Other (comment) (with bil UE support) Hip Flexion/Marching: AROM;Standing;Both;15 reps;Other (comment) (with bil UE support) Heel Raises: AROM;Other (comment);Both;15 reps (with bil UE support) Mini-Sqauts: AROM;Other (comment);Both;10 reps (with bil UE support)   PT Diagnosis:    PT Problem List:   PT Treatment Interventions:     PT Goals Acute Rehab PT Goals PT Goal: Sit to Stand - Progress: Progressing toward goal PT Goal: Ambulate - Progress: Progressing toward goal Additional Goals PT Goal: Additional Goal #1 - Progress: Progressing toward goal  Visit Information  Last PT Received On: 06/25/12 Assistance Needed: +1    Subjective Data  Subjective: [facility] and I use the ladies as a cane but they don't know it.   Cognition  Cognition Arousal/Alertness: Awake/alert Behavior During Therapy: WFL for tasks assessed/performed Overall Cognitive Status: Within Functional Limits for tasks assessed    Balance     End of Session PT - End of Session Activity Tolerance: Patient tolerated treatment well Patient left: in bed;with bed alarm set;with call bell/phone within reach   GP     Jontavia Leatherbury,KATHrine E 06/25/2012, 10:22 AM Zenovia Jarred, PT, DPT 06/25/2012 Pager: [phone]

## 2012-06-25 NOTE — Progress Notes (Signed)
CSW provided patient with bed offers. [facility] still considering and [facility] has declined due to not contracting with insurance. CSW to follow.  Maurina Fawaz C. Estefan Pattison MSW, LCSW [phone]

## 2012-06-25 NOTE — Progress Notes (Signed)
Subjective: Clifford Spencer is feeling better today.  Does not feel less thirsty.  More energetic and alert.  Able to sit up by himself.  He is having some loose stools and 1 out of 2 is heme positive.  He has Klebsiella pneumonia and his urine and there is a gram-negative rod from a blood culture.  Sensitive to Rocephin.  Objective: Weight change: 0.8 kg (1 lb 12.2 oz)  Intake/Output Summary (Last 24 hours) at 06/25/12 0744 Last data filed at 06/25/12 0600  Gross per 24 hour  Intake 2998.67 ml  Output   1101 ml  Net 1897.67 ml   Filed Vitals:   06/24/12 0534 06/24/12 1355 06/24/12 2114 06/25/12 0550  BP: 119/67 124/68 127/51 166/65  Pulse: 58 60 55 60  Temp: 97.4 F (36.3 C) 97.5 F (36.4 C) 98.9 F (37.2 C) 98.3 F (36.8 C)  TempSrc: Oral Oral Oral Oral  Resp: 16 18 20 20   Height:      Weight:    72.1 kg (158 lb 15.2 oz)  SpO2: 95% 96% 94% 95%   General: Elderly male conversive and comfortable except for complaint of urethral burning  HEENT: Wears dentures. Mouth is parched with tongue quite dry and wrinkled  Neck: Supple  Lungs: Left greater than right basilar rales on inspiration  Cardiac: Regular rhythm without gallop  Abdomen: Soft nontender nondistended with normal bowel sounds  GU: Penis appears normal in urine in Foley is no longer bloody but slightly cloudy  Extremities: Without cyanosis clubbing or edema  Neurological: Oriented x2, alert and conversive. Recognizes me. Moves all extremities well. Able to sit up on his own   Lab Results: Results for orders placed during the hospital encounter of 06/22/12 (from the past 48 hour(s))  BASIC METABOLIC PANEL     Status: Abnormal   Collection Time    06/24/12  4:25 AM      Result Value Range   Sodium 136  135 - 145 mEq/L   Potassium 3.7  3.5 - 5.1 mEq/L   Chloride 105  96 - 112 mEq/L   CO2 26  19 - 32 mEq/L   Glucose, Bld 113 (*) 70 - 99 mg/dL   BUN 19  6 - 23 mg/dL   Creatinine, Ser 1.47 (*) 0.50 - 1.35 mg/dL   Calcium 8.0 (*) 8.4 - 10.5 mg/dL   GFR calc non Af Amer 45 (*) >90 mL/min   GFR calc Af Amer 52 (*) >90 mL/min   Comment:            The eGFR has been calculated     using the CKD EPI equation.     This calculation has not been     validated in all clinical     situations.     eGFR's persistently     <90 mL/min signify     possible Chronic Kidney Disease.  CBC     Status: Abnormal   Collection Time    06/24/12  4:25 AM      Result Value Range   WBC 18.0 (*) 4.0 - 10.5 K/uL   RBC 3.60 (*) 4.22 - 5.81 MIL/uL   Hemoglobin 10.8 (*) 13.0 - 17.0 g/dL   HCT 82.9 (*) 56.2 - 13.0 %   MCV 90.0  78.0 - 100.0 fL   MCH 30.0  26.0 - 34.0 pg   MCHC 33.3  30.0 - 36.0 g/dL   RDW 86.5  78.4 - 69.6 %   Platelets 141 (*)  150 - 400 K/uL  OCCULT BLOOD X 1 CARD TO LAB, STOOL     Status: Abnormal   Collection Time    06/24/12 11:13 PM      Result Value Range   Fecal Occult Bld POSITIVE (*) NEGATIVE  BASIC METABOLIC PANEL     Status: Abnormal   Collection Time    06/25/12  5:08 AM      Result Value Range   Sodium 138  135 - 145 mEq/L   Potassium 3.1 (*) 3.5 - 5.1 mEq/L   Chloride 106  96 - 112 mEq/L   CO2 25  19 - 32 mEq/L   Glucose, Bld 141 (*) 70 - 99 mg/dL   BUN 17  6 - 23 mg/dL   Creatinine, Ser 1.61  0.50 - 1.35 mg/dL   Calcium 8.2 (*) 8.4 - 10.5 mg/dL   GFR calc non Af Amer 48 (*) >90 mL/min   GFR calc Af Amer 56 (*) >90 mL/min   Comment:            The eGFR has been calculated     using the CKD EPI equation.     This calculation has not been     validated in all clinical     situations.     eGFR's persistently     <90 mL/min signify     possible Chronic Kidney Disease.  CBC WITH DIFFERENTIAL     Status: Abnormal   Collection Time    06/25/12  5:08 AM      Result Value Range   WBC 14.1 (*) 4.0 - 10.5 K/uL   RBC 3.53 (*) 4.22 - 5.81 MIL/uL   Hemoglobin 10.8 (*) 13.0 - 17.0 g/dL   HCT 09.6 (*) 04.5 - 40.9 %   MCV 88.4  78.0 - 100.0 fL   MCH 30.6  26.0 - 34.0 pg   MCHC 34.6   30.0 - 36.0 g/dL   RDW 81.1  91.4 - 78.2 %   Platelets 137 (*) 150 - 400 K/uL   Neutrophils Relative % 80 (*) 43 - 77 %   Neutro Abs 11.3 (*) 1.7 - 7.7 K/uL   Lymphocytes Relative 11 (*) 12 - 46 %   Lymphs Abs 1.5  0.7 - 4.0 K/uL   Monocytes Relative 7  3 - 12 %   Monocytes Absolute 1.0  0.1 - 1.0 K/uL   Eosinophils Relative 2  0 - 5 %   Eosinophils Absolute 0.3  0.0 - 0.7 K/uL   Basophils Relative 0  0 - 1 %   Basophils Absolute 0.0  0.0 - 0.1 K/uL  OCCULT BLOOD X 1 CARD TO LAB, STOOL     Status: None   Collection Time    06/25/12  5:55 AM      Result Value Range   Fecal Occult Bld NEGATIVE  NEGATIVE    Studies/Results: Dg Chest 2 View  06/23/2012   *RADIOLOGY REPORT*  Clinical Data: Follow up pneumonia  CHEST - 2 VIEW  Comparison: 06/22/2012  Findings: Chronic interstitial markings.  Mild left lower lobe opacity is possible, more conspicuous on the lateral view.  No pleural effusion or pneumothorax.  Cardiomegaly. Postsurgical changes related to prior CABG.  Degenerative changes of the visualized thoracolumbar spine.  IMPRESSION: Possible mild left lower lobe opacity / pneumonia, unchanged.   Original Report Authenticated By: Charline Bills, M.D.   Medications: Scheduled Meds: . cefTRIAXone (ROCEPHIN)  IV  1 g Intravenous Q24H  .  metoprolol tartrate  25 mg Oral BID  . pantoprazole (PROTONIX) IV  40 mg Intravenous Q24H  . PARoxetine  10 mg Oral Daily  . potassium chloride  20 mEq Oral BID  . saccharomyces boulardii  250 mg Oral BID  . sodium chloride  3 mL Intravenous Q12H  . tamsulosin  0.4 mg Oral QPC supper  . vitamin B-12  1,000 mcg Oral Daily   Continuous Infusions: . sodium chloride 0.45 % with kcl     PRN Meds:.acetaminophen, acetaminophen, diphenoxylate-atropine, lidocaine, ondansetron (ZOFRAN) IV, ondansetron  Assessment/Plan: Principal Problem:   Community acquired pneumonia - chest x-ray did not reveal pneumonia.  Likely bronchitis - will be switching to  Rocephin for gram-negative bacteremia and Klebsiella pneumonia UTI Active Problems:   UTI (lower urinary tract infection) - as above   Dementia -  patient with progressive dementia.  Starting to have activities at home that are dangerous to self and others such as leaving doors open and water running and appliances inappropriately - needs placement, likely assisted living ultimately   CAD (coronary artery disease) - asymptomatic   Acute encephalopathy - soft   Hematuria - grossly resolved   BPH (benign prostatic hyperplasia)   Dehydration - oropharynx with good hydration today   Penile pain - improved with removal of catheter   DNR no code (do not resuscitate)   Gram-negative bacteremia - likely Klebsiella pneumoniae.  Sensitive to Rocephin from urine culture.  I would say this is the likely source.   Heme positive stool - 1- 2 stools heme positive.  Will monitor hemoglobin and repeat Hemoccult   Hypokalemia - replace IV and by mouth.  Likely secondary to diarrhea   Diarrhea - probably antibiotic associated.  We'll be switching to Rocephin.  We'll check C. difficile if there is one more loose stool per protocol   Disposition - to skilled nursing facility for physical therapy for deconditioning and hopefully to assisted living thereafter    LOS: 3 days   Pearla Dubonnet, MD 06/25/2012, 7:44 AM

## 2012-06-25 NOTE — Progress Notes (Signed)
06-25-12-  0500  Lab reports "BLOOD CULTURE + = GRAM NEGATVIE RODS IN ANEROBIC BOTTLE ONLY" .   Pt is on Levaquin and zosyn .

## 2012-06-26 LAB — BASIC METABOLIC PANEL
CO2: 25 mEq/L (ref 19–32)
Calcium: 8.6 mg/dL (ref 8.4–10.5)
Creatinine, Ser: 1.21 mg/dL (ref 0.50–1.35)

## 2012-06-26 LAB — CBC WITH DIFFERENTIAL/PLATELET
Basophils Absolute: 0 10*3/uL (ref 0.0–0.1)
Basophils Relative: 0 % (ref 0–1)
Eosinophils Absolute: 0.4 10*3/uL (ref 0.0–0.7)
Eosinophils Relative: 3 % (ref 0–5)
HCT: 32.9 % — ABNORMAL LOW (ref 39.0–52.0)
Lymphocytes Relative: 15 % (ref 12–46)
MCH: 28.4 pg (ref 26.0–34.0)
MCHC: 32.2 g/dL (ref 30.0–36.0)
MCV: 88.2 fL (ref 78.0–100.0)
Monocytes Absolute: 0.9 10*3/uL (ref 0.1–1.0)
RDW: 13.9 % (ref 11.5–15.5)

## 2012-06-26 MED ORDER — LEVOFLOXACIN 500 MG PO TABS
500.0000 mg | ORAL_TABLET | Freq: Every day | ORAL | Status: DC
  Administered 2012-06-26 – 2012-06-27 (×2): 500 mg via ORAL
  Filled 2012-06-26 (×2): qty 1

## 2012-06-26 MED ORDER — ACETAMINOPHEN 325 MG PO TABS: 650.0000 mg | ORAL_TABLET | Freq: Four times a day (QID) | ORAL | Status: DC | PRN

## 2012-06-26 MED ORDER — DIPHENOXYLATE-ATROPINE 2.5-0.025 MG PO TABS: 1.0000 | ORAL_TABLET | Freq: Four times a day (QID) | ORAL | Status: DC | PRN

## 2012-06-26 MED ORDER — SACCHAROMYCES BOULARDII 250 MG PO CAPS: 250.0000 mg | ORAL_CAPSULE | Freq: Two times a day (BID) | ORAL | Status: AC

## 2012-06-26 NOTE — Progress Notes (Signed)
Stopped in to see patient today. He had been walking in the hall with physical therapy and indicated that he would welcome a visit. He was pleasant with mild dementia  I offered him encouragement and offered a prayer for peace and calm in the days ahead as he transitions possibly to a SNF.

## 2012-06-26 NOTE — Progress Notes (Signed)
Physical Therapy Treatment Patient Details Name: Clifford Spencer MRN: [id number] DOB: [birth year] Today's Date: 06/26/2012 Time: 4098-1191 PT Time Calculation (min): 25 min  PT Assessment / Plan / Recommendation Comments on Treatment Session  Pt is able to amb a great distance however is unsteady. Pt demon gait of flex hips and knees with low clearanced shuffled gait. Pt has a hx of falls and c/o tremors throughout.   BERG balance test performed in which pt scored 39/56 indicating HIGH FALL RISK.  TUG (time up and go) test performed in which pt required 27 sec(>13 sec indicate HIGH FALL RISK) to complete also indicating HIGH FALL RISK.   Pt would benefit from ST Rehab at SNF to address stated issues and increase safety during gait.    Follow Up Recommendations  SNF;Supervision/Assistance - 24 hour     Does the patient have the potential to tolerate intense rehabilitation     Barriers to Discharge        Equipment Recommendations       Recommendations for Other Services    Frequency     Plan Discharge plan remains appropriate;Frequency remains appropriate    Precautions / Restrictions Precautions Precaution Comments: pt states history of balance disorder/tremors Restrictions Weight Bearing Restrictions: No   Pertinent Vitals/Pain No c/o pain    Mobility  Bed Mobility Bed Mobility: Supine to Sit;Sit to Supine Supine to Sit: 6: Modified independent (Device/Increase time) Sit to Supine: 6: Modified independent (Device/Increase time) Details for Bed Mobility Assistance: increased time Transfers Transfers: Sit to Stand;Stand to Sit Sit to Stand: From bed;5: Supervision;From toilet Stand to Sit: 5: Supervision;To toilet;To bed Details for Transfer Assistance: increased time Ambulation/Gait Ambulation/Gait Assistance: 4: Min guard Ambulation Distance (Feet): 400 Feet Assistive device: Straight cane;1 person hand held assist Ambulation/Gait Assistance Details: Pt able to amb  great distance however requires increased time and although uses a straight cane he also requires one hand held assist on the oposite side. Noted mild unsteadyness with tremors throughout. Gait Pattern: Step-through pattern;Decreased stride length;Narrow base of support;Shuffle Gait velocity: decreased     PT Goals                                                        progressing    Visit Information  Last PT Received On: 06/26/12 Assistance Needed: +1    Subjective Data  Subjective: I feel unsteady   Cognition    good   Balance  Balance Balance Assessed: Yes Standardized Balance Assessment Standardized Balance Assessment: Berg Balance Test;Timed Up and Go Test Berg Balance Test Sit to Stand: Able to stand without using hands and stabilize independently Standing Unsupported: Able to stand safely 2 minutes Sitting with Back Unsupported but Feet Supported on Floor or Stool: Able to sit safely and securely 2 minutes Stand to Sit: Sits safely with minimal use of hands Transfers: Able to transfer safely, minor use of hands Standing Unsupported with Eyes Closed: Able to stand 10 seconds safely Standing Ubsupported with Feet Together: Needs help to attain position and unable to hold for 15 seconds From Standing, Reach Forward with Outstretched Arm: Can reach forward >12 cm safely (5") From Standing Position, Pick up Object from Floor: Able to pick up shoe, needs supervision From Standing Position, Turn to Look Behind Over each Shoulder: Looks behind from  both sides and weight shifts well Turn 360 Degrees: Able to turn 360 degrees safely but slowly Standing Unsupported, Alternately Place Feet on Step/Stool: Able to complete >2 steps/needs minimal assist Standing Unsupported, One Foot in Front: Needs help to step but can hold 15 seconds Standing on One Leg: Tries to lift leg/unable to hold 3 seconds but remains standing independently Total Score: 39    Indicating HIGH FALL RISK Timed  Up and Go Test TUG: Normal TUG Normal TUG (seconds): 27 sec (>13 sec indicate HIGH FALL RISK)   End of Session PT - End of Session Equipment Utilized During Treatment: Gait belt Activity Tolerance: Patient tolerated treatment well Patient left: in bed;with bed alarm set;with call bell/phone within reach   Felecia Shelling  PTA [facility]  Acute  Rehab Pager      [phone]

## 2012-06-26 NOTE — Progress Notes (Signed)
Subjective: Unfortunately, Clifford Spencer refused SNF placement for Clifford Spencer. It is unsafe to discharge him right at the moment until we have safe measures in place at his home with home health nursing and physical therapy to be set up in provided this afternoon. Cannot contact family members who were expecting discharge today to SNF. We'll plan for morning discharge tomorrow with home health in place  Objective: Weight change: -1 kg (-2 lb 3.3 oz)  Intake/Output Summary (Last 24 hours) at 06/26/12 1432 Last data filed at 06/26/12 1429  Gross per 24 hour  Intake   1775 ml  Output   1501 ml  Net    274 ml   Filed Vitals:   06/25/12 1408 06/25/12 2058 06/26/12 0500 06/26/12 0512  BP: 130/65 148/69  144/42  Pulse: 62 58  58  Temp: 98.3 F (36.8 C) 98.4 F (36.9 C)  97.5 F (36.4 C)  TempSrc: Oral Oral  Oral  Resp: 20 18  18   Height:      Weight:   71.1 kg (156 lb 12 oz)   SpO2: 96% 96%  96%    Lab Results: Results for orders placed during the hospital encounter of 06/22/12 (from the past 48 hour(s))  OCCULT BLOOD X 1 CARD TO LAB, STOOL     Status: Abnormal   Collection Time    06/24/12 11:13 PM      Result Value Range   Fecal Occult Bld POSITIVE (*) NEGATIVE  BASIC METABOLIC PANEL     Status: Abnormal   Collection Time    06/25/12  5:08 AM      Result Value Range   Sodium 138  135 - 145 mEq/L   Potassium 3.1 (*) 3.5 - 5.1 mEq/L   Chloride 106  96 - 112 mEq/L   CO2 25  19 - 32 mEq/L   Glucose, Bld 141 (*) 70 - 99 mg/dL   BUN 17  6 - 23 mg/dL   Creatinine, Ser 1.19  0.50 - 1.35 mg/dL   Calcium 8.2 (*) 8.4 - 10.5 mg/dL   GFR calc non Af Amer 48 (*) >90 mL/min   GFR calc Af Amer 56 (*) >90 mL/min   Comment:            The eGFR has been calculated     using the CKD EPI equation.     This calculation has not been     validated in all clinical     situations.     eGFR's persistently     <90 mL/min signify     possible Chronic Kidney Disease.  CBC WITH  DIFFERENTIAL     Status: Abnormal   Collection Time    06/25/12  5:08 AM      Result Value Range   WBC 14.1 (*) 4.0 - 10.5 K/uL   RBC 3.53 (*) 4.22 - 5.81 MIL/uL   Hemoglobin 10.8 (*) 13.0 - 17.0 g/dL   HCT 14.7 (*) 82.9 - 56.2 %   MCV 88.4  78.0 - 100.0 fL   MCH 30.6  26.0 - 34.0 pg   MCHC 34.6  30.0 - 36.0 g/dL   RDW 13.0  86.5 - 78.4 %   Platelets 137 (*) 150 - 400 K/uL   Neutrophils Relative % 80 (*) 43 - 77 %   Neutro Abs 11.3 (*) 1.7 - 7.7 K/uL   Lymphocytes Relative 11 (*) 12 - 46 %   Lymphs Abs 1.5  0.7 -  4.0 K/uL   Monocytes Relative 7  3 - 12 %   Monocytes Absolute 1.0  0.1 - 1.0 K/uL   Eosinophils Relative 2  0 - 5 %   Eosinophils Absolute 0.3  0.0 - 0.7 K/uL   Basophils Relative 0  0 - 1 %   Basophils Absolute 0.0  0.0 - 0.1 K/uL  OCCULT BLOOD X 1 CARD TO LAB, STOOL     Status: None   Collection Time    06/25/12  5:55 AM      Result Value Range   Fecal Occult Bld NEGATIVE  NEGATIVE  CBC WITH DIFFERENTIAL     Status: Abnormal   Collection Time    06/26/12  4:43 AM      Result Value Range   WBC 12.6 (*) 4.0 - 10.5 K/uL   RBC 3.73 (*) 4.22 - 5.81 MIL/uL   Hemoglobin 10.6 (*) 13.0 - 17.0 g/dL   HCT 56.4 (*) 33.2 - 95.1 %   MCV 88.2  78.0 - 100.0 fL   MCH 28.4  26.0 - 34.0 pg   MCHC 32.2  30.0 - 36.0 g/dL   RDW 88.4  16.6 - 06.3 %   Platelets 126 (*) 150 - 400 K/uL   Neutrophils Relative % 75  43 - 77 %   Neutro Abs 9.5 (*) 1.7 - 7.7 K/uL   Lymphocytes Relative 15  12 - 46 %   Lymphs Abs 1.9  0.7 - 4.0 K/uL   Monocytes Relative 7  3 - 12 %   Monocytes Absolute 0.9  0.1 - 1.0 K/uL   Eosinophils Relative 3  0 - 5 %   Eosinophils Absolute 0.4  0.0 - 0.7 K/uL   Basophils Relative 0  0 - 1 %   Basophils Absolute 0.0  0.0 - 0.1 K/uL  BASIC METABOLIC PANEL     Status: Abnormal   Collection Time    06/26/12  4:43 AM      Result Value Range   Sodium 138  135 - 145 mEq/L   Potassium 3.9  3.5 - 5.1 mEq/L   Comment: NO VISIBLE HEMOLYSIS     DELTA CHECK NOTED    Chloride 106  96 - 112 mEq/L   CO2 25  19 - 32 mEq/L   Glucose, Bld 133 (*) 70 - 99 mg/dL   BUN 14  6 - 23 mg/dL   Creatinine, Ser 0.16  0.50 - 1.35 mg/dL   Calcium 8.6  8.4 - 01.0 mg/dL   GFR calc non Af Amer 53 (*) >90 mL/min   GFR calc Af Amer 62 (*) >90 mL/min   Comment:            The eGFR has been calculated     using the CKD EPI equation.     This calculation has not been     validated in all clinical     situations.     eGFR's persistently     <90 mL/min signify     possible Chronic Kidney Disease.    Studies/Results: No results found. Medications: Scheduled Meds: . cefTRIAXone (ROCEPHIN)  IV  1 g Intravenous Q24H  . levofloxacin  500 mg Oral Daily  . metoprolol tartrate  25 mg Oral BID  . pantoprazole (PROTONIX) IV  40 mg Intravenous Q24H  . PARoxetine  10 mg Oral Daily  . potassium chloride  20 mEq Oral BID  . saccharomyces boulardii  250 mg Oral BID  . tamsulosin  0.4 mg Oral QPC supper  . vitamin B-12  1,000 mcg Oral Daily   Continuous Infusions:  PRN Meds:.acetaminophen, acetaminophen, diphenoxylate-atropine, lidocaine, ondansetron (ZOFRAN) IV, ondansetron  Assessment/Plan: Principal Problem:   Community acquired pneumonia Active Problems:   UTI (lower urinary tract infection)   CAD (coronary artery disease)   Acute encephalopathy   Hematuria   BPH (benign prostatic hyperplasia)   Dehydration   Penile pain   DNR no code (do not resuscitate)   Gram-negative bacteremia   Heme positive stool   Hypokalemia   Diarrhea   Cognitive deficits  Disposition - plan for discharge home with home health tomorrow secondary to declination by [facility] for SNF admission for further treatment of infection and for further physical therapy and occupational therapy for strengthening and gait assessment prior to assisted living placement. We'll plan for discharge in a.m. When he can be safely managed with appropriate home care in place   LOS: 4 days   Clifford Dubonnet, MD 06/26/2012, 2:32 PM

## 2012-06-26 NOTE — Progress Notes (Addendum)
CSW submitted auth. [facility] accepted to [facility]. [employer] has denied snf. CSW met with patient and explained denial. Patient was disappointed but understood. He states that his son mike will take him home.  Allayah Raineri C. Lanaysia Fritchman MSW, [place] [phone] CSW spoke with patient's daughter in law,janet [phone]) discussed snf denial. She was very upset. CSW explained appeals process and process for applying for medicaid and attempting to get patient into alf once home. She vented for several minutes. CSW explained process as well as she could and offered emotional support.  Jasmin Winberry C. Zaela Graley MSW, LCSW [phone]

## 2012-06-26 NOTE — Discharge Summary (Signed)
Physician Discharge Summary  NAME:Clifford Spencer  GNF:[id number]  DOB: 05/02/1925   Admit date: 06/22/2012 Discharge date: 06/26/2012  Discharge Diagnoses:  Principal Problem:  Community acquired pneumonia - chest x-ray revealed a slight infiltrate in the left lower lobe - could be scarring.  Chest congestion has resolved.  Will be switching to Levaquin by mouth for gram-negative bacteremia and Klebsiella pneumonia UTI  Active Problems:  UTI (lower urinary tract infection) - as above - treat for 7 more days with Levaquin  Dementia - patient with progressive dementia. Starting to have activities at home that are dangerous to self and others such as leaving doors open and water running and appliances inappropriately - needs placement, likely assisted living ultimately  CAD (coronary artery disease) - asymptomatic  Acute encephalopathy - resolved.  Now has baseline memory loss  Hematuria - gross hematuria resolved  BPH (benign prostatic hyperplasia)  Dehydration - resolved  Penile pain - improved with removal of catheter  DNR no code (do not resuscitate)  Gram-negative bacteremia - likely Klebsiella pneumoniae. Sensitive to Rocephin and Levaquin from urine culture. I would say this is the likely source of sepsis  Heme positive stool - 1- 3 stools heme positive.  Can monitor as outpatient  Hypokalemia - resolved with IV and by mouth replacement.  Likely secondary to diarrhea from acute illness and possibly antibiotic associated  Diarrhea - probably antibiotic associated. We'll be switching to Levaquin for posthospital therapy.  Disposition - to skilled nursing facility for physical therapy for deconditioning and hopefully to assisted living thereafter   Discharge Physical Exam:  General Appearance: Alert, cooperative, no distress, appears stated age  Weight change: -1 kg (-2 lb 3.3 oz)  Intake/Output Summary (Last 24 hours) at 06/26/12 0724 Last data filed at 06/26/12 0648  Gross per 24  hour  Intake   1415 ml  Output   2251 ml  Net   -836 ml   Filed Vitals:   06/25/12 1408 06/25/12 2058 06/26/12 0500 06/26/12 0512  BP: 130/65 148/69  144/42  Pulse: 62 58  58  Temp: 98.3 F (36.8 C) 98.4 F (36.9 C)  97.5 F (36.4 C)  TempSrc: Oral Oral  Oral  Resp: 20 18  18   Height:      Weight:   71.1 kg (156 lb 12 oz)   SpO2: 96% 96%  96%   General: Elderly male conversive and comfortable except for complaint of urethral burning  HEENT: Wears dentures. Mouth is moist and tongue is no longer dry or wrinkled  Neck: Supple  Lungs: Clear to auscultation bilaterally  Cardiac: Regular rhythm without gallop  Abdomen: Soft nontender nondistended with normal bowel sounds  GU: Penis appears normal in urine in Foley is no longer bloody but slightly cloudy  Extremities: Without cyanosis clubbing or edema  Neurological: Oriented x2, alert and conversive. Recognizes me. Moves all extremities well. Able to sit up on his own   Discharge Condition: Much improved  Hospital Course: HPI: Clifford Spencer is a 79 y.o. male with known history of CAD status post CABG, mild dementia, hypertension, hyperlipidemia intolerant to statins and history of hematuria with history of bladder calculi was found to be confused on the evening prior to admission by patient's family and patient also had gross hematuria. Patient was brought to the ER and while waiting had one episode of nausea vomiting and was dark-colored vomitus. Patient in the ER was found to be febrile with temperatures running around 104F with lactic acid  levels around 6. Patient was started on empiric antibiotics for sepsis most likely secondary to UTI but later chest x-ray showed a slight left lower lobe infiltrate. Initially care was consulted by ER physician and they have advised to give fluids and recheck lactic acid levels which improved with 2 L normal saline bolus.  Mental status improved fairly quickly with reduction in temperature to 101  Fahrenheit and patient was admitted to the inpatient service. One blood culture grew a gram-negative rod, speciation still pending, but urinalysis grew Klebsiella pneumoniae sensitive to Levaquin and cephalosporins. On the day of admission he was found to be still confused and dehydrated and he also developed diarrhea and: He became hypokalemic.  Rehydrated with intravenous fluids and replace potassium corrected his dehydration and potassium levels.  Mental status has continued to improve and he has done well with physical therapy with ambulation in the halls.  Still weak and needs further strengthening and will be admitted to a skilled nursing facility for further rehabilitation and strengthening and further evaluation for probable placement in assisted living hopefully.  Patient is having worsening memory loss generally and has been performing activities in the home that are potentially dangerous to himself and others.  He will frequently leave doors open and water running at faucets and has been having some falls as well.   Things to follow up in the outpatient setting: Temperature and mental status.  Would repeat chest x-ray in one week to make sure that lower lobe infiltrate has resolved.  Consults: N/A  Disposition: 03-Skilled Nursing Facility  Discharge Orders   Future Orders Complete By Expires     Call MD for:  difficulty breathing, headache or visual disturbances  As directed     Call MD for:  temperature >100.4  As directed     Diet - low sodium heart healthy  As directed     Increase activity slowly  As directed         Medication List    TAKE these medications       acetaminophen 325 MG tablet  Commonly known as:  TYLENOL  Take 2 tablets (650 mg total) by mouth every 6 (six) hours as needed.     aspirin EC 81 MG tablet  Take 81 mg by mouth every morning.     diphenoxylate-atropine 2.5-0.025 MG per tablet  Commonly known as:  LOMOTIL  Take 1 tablet by mouth 4 (four) times  daily as needed for diarrhea or loose stools.     metoprolol tartrate 25 MG tablet  Commonly known as:  LOPRESSOR  Take 25 mg by mouth 2 (two) times daily.     pantoprazole 40 MG tablet  Commonly known as:  PROTONIX  Take 40 mg by mouth every morning.     PARoxetine 10 MG tablet  Commonly known as:  PAXIL  Take 10 mg by mouth every morning.     saccharomyces boulardii 250 MG capsule  Commonly known as:  FLORASTOR  Take 1 capsule (250 mg total) by mouth 2 (two) times daily.     tamsulosin 0.4 MG Caps  Commonly known as:  FLOMAX  Take 0.4 mg by mouth daily after breakfast.     vitamin B-12 1000 MCG tablet  Commonly known as:  CYANOCOBALAMIN  Take 1,000 mcg by mouth every morning.  Levaquin 500 milligrams 1 tablet daily for 7 days          The results of significant diagnostics from this hospitalization (including imaging, microbiology,  ancillary and laboratory) are listed below for reference.    Significant Diagnostic Studies: Dg Chest 2 View  06/23/2012   *RADIOLOGY REPORT*  Clinical Data: Follow up pneumonia  CHEST - 2 VIEW  Comparison: 06/22/2012  Findings: Chronic interstitial markings.  Mild left lower lobe opacity is possible, more conspicuous on the lateral view.  No pleural effusion or pneumothorax.  Cardiomegaly. Postsurgical changes related to prior CABG.  Degenerative changes of the visualized thoracolumbar spine.  IMPRESSION: Possible mild left lower lobe opacity / pneumonia, unchanged.   Original Report Authenticated By: Charline Bills, M.D.   Dg Chest 2 View  06/23/2012   *RADIOLOGY REPORT*  Clinical Data: Altered mental status  CHEST - 2 VIEW  Comparison: 11/13/2010  Findings: Sternotomy wires overlie normal cardiac silhouette. There is mild air space disease in the left lower lobe. Remote posterior left seventh rib fracture.  IMPRESSION: Concern for left lower lobe pneumonia.   Original Report Authenticated By: Genevive Bi, M.D.    Microbiology: Recent  Results (from the past 240 hour(s))  CULTURE, BLOOD (ROUTINE X 2)     Status: None   Collection Time    06/22/12 11:15 PM      Result Value Range Status   Specimen Description Blood   Final   Special Requests NONE   Final   Culture  Setup Time 06/23/2012 18:41   Final   Culture     Final   Value:        BLOOD CULTURE RECEIVED NO GROWTH TO DATE CULTURE WILL BE HELD FOR 5 DAYS BEFORE ISSUING A FINAL NEGATIVE REPORT   Report Status PENDING   Incomplete  URINE CULTURE     Status: None   Collection Time    06/22/12 11:20 PM      Result Value Range Status   Specimen Description URINE, CATHETERIZED   Final   Special Requests NONE   Final   Culture  Setup Time 06/23/2012 14:15   Final   Colony Count >=100,000 COLONIES/ML   Final   Culture KLEBSIELLA PNEUMONIAE   Final   Report Status 06/25/2012 FINAL   Final   Organism ID, Bacteria KLEBSIELLA PNEUMONIAE   Final  CULTURE, BLOOD (ROUTINE X 2)     Status: None   Collection Time    06/22/12 11:27 PM      Result Value Range Status   Specimen Description Blood   Final   Special Requests NONE   Final   Culture  Setup Time 06/23/2012 18:41   Final   Culture     Final   Value: GRAM NEGATIVE RODS     Note: Gram Stain Report Called to,Read Back By and Verified With: KAREN LOCKWOOD 06/25/12 AT 0450 RIDK   Report Status PENDING   Incomplete     Labs: Results for orders placed during the hospital encounter of 06/22/12  CULTURE, BLOOD (ROUTINE X 2)      Result Value Range   Specimen Description Blood     Special Requests NONE     Culture  Setup Time 06/23/2012 18:41     Culture       Value:        BLOOD CULTURE RECEIVED NO GROWTH TO DATE CULTURE WILL BE HELD FOR 5 DAYS BEFORE ISSUING A FINAL NEGATIVE REPORT   Report Status PENDING    CULTURE, BLOOD (ROUTINE X 2)      Result Value Range   Specimen Description Blood     Special Requests NONE  Culture  Setup Time 06/23/2012 18:41     Culture       Value: GRAM NEGATIVE RODS     Note: Gram  Stain Report Called to,Read Back By and Verified With: KAREN LOCKWOOD 06/25/12 AT 0450 RIDK   Report Status PENDING    URINE CULTURE      Result Value Range   Specimen Description URINE, CATHETERIZED     Special Requests NONE     Culture  Setup Time 06/23/2012 14:15     Colony Count >=100,000 COLONIES/ML     Culture KLEBSIELLA PNEUMONIAE     Report Status 06/25/2012 FINAL     Organism ID, Bacteria KLEBSIELLA PNEUMONIAE    CBC WITH DIFFERENTIAL      Result Value Range   WBC 2.7 (*) 4.0 - 10.5 K/uL   RBC 4.55  4.22 - 5.81 MIL/uL   Hemoglobin 13.1  13.0 - 17.0 g/dL   HCT 78.4  69.6 - 29.5 %   MCV 91.0  78.0 - 100.0 fL   MCH 28.8  26.0 - 34.0 pg   MCHC 31.6  30.0 - 36.0 g/dL   RDW 28.4  13.2 - 44.0 %   Platelets 177  150 - 400 K/uL   Neutrophils Relative % 83 (*) 43 - 77 %   Neutro Abs 2.2  1.7 - 7.7 K/uL   Lymphocytes Relative 15  12 - 46 %   Lymphs Abs 0.4 (*) 0.7 - 4.0 K/uL   Monocytes Relative 1 (*) 3 - 12 %   Monocytes Absolute 0.0 (*) 0.1 - 1.0 K/uL   Eosinophils Relative 2  0 - 5 %   Eosinophils Absolute 0.0  0.0 - 0.7 K/uL   Basophils Relative 0  0 - 1 %   Basophils Absolute 0.0  0.0 - 0.1 K/uL  COMPREHENSIVE METABOLIC PANEL      Result Value Range   Sodium 140  135 - 145 mEq/L   Potassium 3.6  3.5 - 5.1 mEq/L   Chloride 100  96 - 112 mEq/L   CO2 21  19 - 32 mEq/L   Glucose, Bld 146 (*) 70 - 99 mg/dL   BUN 23  6 - 23 mg/dL   Creatinine, Ser 1.02  0.50 - 1.35 mg/dL   Calcium 9.0  8.4 - 72.5 mg/dL   Total Protein 7.8  6.0 - 8.3 g/dL   Albumin 3.3 (*) 3.5 - 5.2 g/dL   AST 18  0 - 37 U/L   ALT 15  0 - 53 U/L   Alkaline Phosphatase 139 (*) 39 - 117 U/L   Total Bilirubin 0.2 (*) 0.3 - 1.2 mg/dL   GFR calc non Af Amer 52 (*) >90 mL/min   GFR calc Af Amer 60 (*) >90 mL/min  URINALYSIS, ROUTINE W REFLEX MICROSCOPIC      Result Value Range   Color, Urine YELLOW  YELLOW   APPearance CLOUDY (*) CLEAR   Specific Gravity, Urine 1.013  1.005 - 1.030   pH 5.5  5.0 - 8.0    Glucose, UA NEGATIVE  NEGATIVE mg/dL   Hgb urine dipstick LARGE (*) NEGATIVE   Bilirubin Urine NEGATIVE  NEGATIVE   Ketones, ur NEGATIVE  NEGATIVE mg/dL   Protein, ur 30 (*) NEGATIVE mg/dL   Urobilinogen, UA 0.2  0.0 - 1.0 mg/dL   Nitrite NEGATIVE  NEGATIVE   Leukocytes, UA SMALL (*) NEGATIVE  TROPONIN I      Result Value Range   Troponin I <0.30  <  0.30 ng/mL  PROTIME-INR      Result Value Range   Prothrombin Time 12.5  11.6 - 15.2 seconds   INR 0.94  0.00 - 1.49  PROCALCITONIN      Result Value Range   Procalcitonin 0.15    URINE MICROSCOPIC-ADD ON      Result Value Range   WBC, UA 7-10  <3 WBC/hpf   RBC / HPF 11-20  <3 RBC/hpf  COMPREHENSIVE METABOLIC PANEL      Result Value Range   Sodium 142  135 - 145 mEq/L   Potassium 3.4 (*) 3.5 - 5.1 mEq/L   Chloride 107  96 - 112 mEq/L   CO2 24  19 - 32 mEq/L   Glucose, Bld 153 (*) 70 - 99 mg/dL   BUN 20  6 - 23 mg/dL   Creatinine, Ser 4.78  0.50 - 1.35 mg/dL   Calcium 8.2 (*) 8.4 - 10.5 mg/dL   Total Protein 6.5  6.0 - 8.3 g/dL   Albumin 2.7 (*) 3.5 - 5.2 g/dL   AST 16  0 - 37 U/L   ALT 10  0 - 53 U/L   Alkaline Phosphatase 89  39 - 117 U/L   Total Bilirubin 0.4  0.3 - 1.2 mg/dL   GFR calc non Af Amer 52 (*) >90 mL/min   GFR calc Af Amer 60 (*) >90 mL/min  CBC WITH DIFFERENTIAL      Result Value Range   WBC 20.9 (*) 4.0 - 10.5 K/uL   RBC 4.04 (*) 4.22 - 5.81 MIL/uL   Hemoglobin 11.7 (*) 13.0 - 17.0 g/dL   HCT 29.5 (*) 62.1 - 30.8 %   MCV 90.3  78.0 - 100.0 fL   MCH 29.0  26.0 - 34.0 pg   MCHC 32.1  30.0 - 36.0 g/dL   RDW 65.7  84.6 - 96.2 %   Platelets 158  150 - 400 K/uL   Neutrophils Relative % 93 (*) 43 - 77 %   Neutro Abs 19.4 (*) 1.7 - 7.7 K/uL   Lymphocytes Relative 2 (*) 12 - 46 %   Lymphs Abs 0.3 (*) 0.7 - 4.0 K/uL   Monocytes Relative 5  3 - 12 %   Monocytes Absolute 1.1 (*) 0.1 - 1.0 K/uL   Eosinophils Relative 0  0 - 5 %   Eosinophils Absolute 0.0  0.0 - 0.7 K/uL   Basophils Relative 0  0 - 1 %    Basophils Absolute 0.0  0.0 - 0.1 K/uL  LACTIC ACID, PLASMA      Result Value Range   Lactic Acid, Venous 2.9 (*) 0.5 - 2.2 mmol/L  BASIC METABOLIC PANEL      Result Value Range   Sodium 136  135 - 145 mEq/L   Potassium 3.7  3.5 - 5.1 mEq/L   Chloride 105  96 - 112 mEq/L   CO2 26  19 - 32 mEq/L   Glucose, Bld 113 (*) 70 - 99 mg/dL   BUN 19  6 - 23 mg/dL   Creatinine, Ser 9.52 (*) 0.50 - 1.35 mg/dL   Calcium 8.0 (*) 8.4 - 10.5 mg/dL   GFR calc non Af Amer 45 (*) >90 mL/min   GFR calc Af Amer 52 (*) >90 mL/min  CBC      Result Value Range   WBC 18.0 (*) 4.0 - 10.5 K/uL   RBC 3.60 (*) 4.22 - 5.81 MIL/uL   Hemoglobin 10.8 (*) 13.0 - 17.0  g/dL   HCT 40.9 (*) 81.1 - 91.4 %   MCV 90.0  78.0 - 100.0 fL   MCH 30.0  26.0 - 34.0 pg   MCHC 33.3  30.0 - 36.0 g/dL   RDW 78.2  95.6 - 21.3 %   Platelets 141 (*) 150 - 400 K/uL  BASIC METABOLIC PANEL      Result Value Range   Sodium 138  135 - 145 mEq/L   Potassium 3.1 (*) 3.5 - 5.1 mEq/L   Chloride 106  96 - 112 mEq/L   CO2 25  19 - 32 mEq/L   Glucose, Bld 141 (*) 70 - 99 mg/dL   BUN 17  6 - 23 mg/dL   Creatinine, Ser 0.86  0.50 - 1.35 mg/dL   Calcium 8.2 (*) 8.4 - 10.5 mg/dL   GFR calc non Af Amer 48 (*) >90 mL/min   GFR calc Af Amer 56 (*) >90 mL/min  CBC WITH DIFFERENTIAL      Result Value Range   WBC 14.1 (*) 4.0 - 10.5 K/uL   RBC 3.53 (*) 4.22 - 5.81 MIL/uL   Hemoglobin 10.8 (*) 13.0 - 17.0 g/dL   HCT 57.8 (*) 46.9 - 62.9 %   MCV 88.4  78.0 - 100.0 fL   MCH 30.6  26.0 - 34.0 pg   MCHC 34.6  30.0 - 36.0 g/dL   RDW 52.8  41.3 - 24.4 %   Platelets 137 (*) 150 - 400 K/uL   Neutrophils Relative % 80 (*) 43 - 77 %   Neutro Abs 11.3 (*) 1.7 - 7.7 K/uL   Lymphocytes Relative 11 (*) 12 - 46 %   Lymphs Abs 1.5  0.7 - 4.0 K/uL   Monocytes Relative 7  3 - 12 %   Monocytes Absolute 1.0  0.1 - 1.0 K/uL   Eosinophils Relative 2  0 - 5 %   Eosinophils Absolute 0.3  0.0 - 0.7 K/uL   Basophils Relative 0  0 - 1 %   Basophils Absolute 0.0   0.0 - 0.1 K/uL  OCCULT BLOOD X 1 CARD TO LAB, STOOL      Result Value Range   Fecal Occult Bld POSITIVE (*) NEGATIVE  OCCULT BLOOD X 1 CARD TO LAB, STOOL      Result Value Range   Fecal Occult Bld NEGATIVE  NEGATIVE  CBC WITH DIFFERENTIAL      Result Value Range   WBC 12.6 (*) 4.0 - 10.5 K/uL   RBC 3.73 (*) 4.22 - 5.81 MIL/uL   Hemoglobin 10.6 (*) 13.0 - 17.0 g/dL   HCT 01.0 (*) 27.2 - 53.6 %   MCV 88.2  78.0 - 100.0 fL   MCH 28.4  26.0 - 34.0 pg   MCHC 32.2  30.0 - 36.0 g/dL   RDW 64.4  03.4 - 74.2 %   Platelets 126 (*) 150 - 400 K/uL   Neutrophils Relative % 75  43 - 77 %   Neutro Abs 9.5 (*) 1.7 - 7.7 K/uL   Lymphocytes Relative 15  12 - 46 %   Lymphs Abs 1.9  0.7 - 4.0 K/uL   Monocytes Relative 7  3 - 12 %   Monocytes Absolute 0.9  0.1 - 1.0 K/uL   Eosinophils Relative 3  0 - 5 %   Eosinophils Absolute 0.4  0.0 - 0.7 K/uL   Basophils Relative 0  0 - 1 %   Basophils Absolute 0.0  0.0 - 0.1  K/uL  BASIC METABOLIC PANEL      Result Value Range   Sodium 138  135 - 145 mEq/L   Potassium 3.9  3.5 - 5.1 mEq/L   Chloride 106  96 - 112 mEq/L   CO2 25  19 - 32 mEq/L   Glucose, Bld 133 (*) 70 - 99 mg/dL   BUN 14  6 - 23 mg/dL   Creatinine, Ser 9.60  0.50 - 1.35 mg/dL   Calcium 8.6  8.4 - 45.4 mg/dL   GFR calc non Af Amer 53 (*) >90 mL/min   GFR calc Af Amer 62 (*) >90 mL/min  CG4 I-STAT (LACTIC ACID)      Result Value Range   Lactic Acid, Venous 6.11 (*) 0.5 - 2.2 mmol/L  CG4 I-STAT (LACTIC ACID)      Result Value Range   Lactic Acid, Venous 2.55 (*) 0.5 - 2.2 mmol/L    Time coordinating discharge: 40 minutes  Signed: Pearla Dubonnet, MD 06/26/2012, 7:24 AM

## 2012-06-27 LAB — CULTURE, BLOOD (ROUTINE X 2)

## 2012-06-27 MED ORDER — LEVOFLOXACIN 500 MG PO TABS: 500.0000 mg | ORAL_TABLET | Freq: Every day | ORAL | Status: DC

## 2012-06-27 NOTE — Progress Notes (Signed)
Subjective: Feels well this am - ready for D/C home with [facility] nursing/PT  Objective: Weight change: -0.566 kg (-1 lb 4 oz)  Intake/Output Summary (Last 24 hours) at 06/27/12 0732 Last data filed at 06/27/12 0518  Gross per 24 hour  Intake    720 ml  Output   1650 ml  Net   -930 ml   Filed Vitals:   06/26/12 0512 06/26/12 1455 06/26/12 2140 06/27/12 0514  BP: 144/42 154/65 157/78 154/71  Pulse: 58 59 64 60  Temp: 97.5 F (36.4 C) 98.1 F (36.7 C) 97.5 F (36.4 C) 97.8 F (36.6 C)  TempSrc: Oral Oral Oral Oral  Resp: 18 18 20 16   Height:    5\' 6"  (1.676 m)  Weight:    70.534 kg (155 lb 8 oz)  SpO2: 96% 97% 97% 96%   General: Elderly male conversive and comfortable except for complaint of urethral burning  HEENT: Wears dentures. Mouth is moist and tongue is no longer dry or wrinkled  Neck: Supple  Lungs: Clear to auscultation bilaterally  Cardiac: Regular rhythm without gallop  Abdomen: Soft nontender nondistended with normal bowel sounds  GU: Penis appears normal in urine in Foley is no longer bloody but slightly cloudy  Extremities: Without cyanosis clubbing or edema  Neurological: Oriented x2, alert and conversive. Recognizes me. Moves all extremities well. Able to sit up on his own  Lab Results: Results for orders placed during the hospital encounter of 06/22/12 (from the past 48 hour(s))  CBC WITH DIFFERENTIAL     Status: Abnormal   Collection Time    06/26/12  4:43 AM      Result Value Range   WBC 12.6 (*) 4.0 - 10.5 K/uL   RBC 3.73 (*) 4.22 - 5.81 MIL/uL   Hemoglobin 10.6 (*) 13.0 - 17.0 g/dL   HCT 16.1 (*) 09.6 - 04.5 %   MCV 88.2  78.0 - 100.0 fL   MCH 28.4  26.0 - 34.0 pg   MCHC 32.2  30.0 - 36.0 g/dL   RDW 40.9  81.1 - 91.4 %   Platelets 126 (*) 150 - 400 K/uL   Neutrophils Relative % 75  43 - 77 %   Neutro Abs 9.5 (*) 1.7 - 7.7 K/uL   Lymphocytes Relative 15  12 - 46 %   Lymphs Abs 1.9  0.7 - 4.0 K/uL   Monocytes Relative 7  3 - 12 %   Monocytes  Absolute 0.9  0.1 - 1.0 K/uL   Eosinophils Relative 3  0 - 5 %   Eosinophils Absolute 0.4  0.0 - 0.7 K/uL   Basophils Relative 0  0 - 1 %   Basophils Absolute 0.0  0.0 - 0.1 K/uL  BASIC METABOLIC PANEL     Status: Abnormal   Collection Time    06/26/12  4:43 AM      Result Value Range   Sodium 138  135 - 145 mEq/L   Potassium 3.9  3.5 - 5.1 mEq/L   Comment: NO VISIBLE HEMOLYSIS     DELTA CHECK NOTED   Chloride 106  96 - 112 mEq/L   CO2 25  19 - 32 mEq/L   Glucose, Bld 133 (*) 70 - 99 mg/dL   BUN 14  6 - 23 mg/dL   Creatinine, Ser 7.82  0.50 - 1.35 mg/dL   Calcium 8.6  8.4 - 95.6 mg/dL   GFR calc non Af Amer 53 (*) >90 mL/min   GFR  calc Af Amer 62 (*) >90 mL/min   Comment:            The eGFR has been calculated     using the CKD EPI equation.     This calculation has not been     validated in all clinical     situations.     eGFR's persistently     <90 mL/min signify     possible Chronic Kidney Disease.    Studies/Results: No results found. Medications: Scheduled Meds: . cefTRIAXone (ROCEPHIN)  IV  1 g Intravenous Q24H  . levofloxacin  500 mg Oral Daily  . metoprolol tartrate  25 mg Oral BID  . pantoprazole (PROTONIX) IV  40 mg Intravenous Q24H  . PARoxetine  10 mg Oral Daily  . potassium chloride  20 mEq Oral BID  . saccharomyces boulardii  250 mg Oral BID  . tamsulosin  0.4 mg Oral QPC supper  . vitamin B-12  1,000 mcg Oral Daily   Continuous Infusions:  PRN Meds:.acetaminophen, acetaminophen, diphenoxylate-atropine, lidocaine, ondansetron (ZOFRAN) IV, ondansetron  Assessment/Plan:  Discharge Diagnoses:  Principal Problem:  Community acquired pneumonia - chest x-ray revealed a slight infiltrate in the left lower lobe - could be scarring. Chest congestion has resolved. Will be switching to Levaquin by mouth for gram-negative bacteremia and Klebsiella pneumonia UTI for seven  days  Active Problems:  UTI (lower urinary tract infection) - as above - treat for 7  more days with Levaquin  Dementia - patient with progressive dementia. Starting to have activities at home that are dangerous to self and others such as leaving doors open and water running and appliances inappropriately - needs placement, likely assisted living ultimately  CAD (coronary artery disease) - asymptomatic  Acute encephalopathy - resolved. Now has baseline memory loss  Hematuria - gross hematuria resolved  BPH (benign prostatic hyperplasia)  Dehydration - resolved  Penile pain - improved with removal of catheter  DNR no code (do not resuscitate)  Gram-negative bacteremia - likely Klebsiella pneumoniae. Sensitive to Rocephin and Levaquin from urine culture. I would say this is the likely source of sepsis  Heme positive stool - 1- 3 stools heme positive. Can monitor as outpatient  Hypokalemia - resolved with IV and by mouth replacement. Likely secondary to diarrhea from acute illness and possibly antibiotic associated  Diarrhea - probably antibiotic associated. We'll be switching to Levaquin for posthospital therapy. Lomotil as needed Disposition - to skilled nursing facility for physical therapy for deconditioning and hopefully to assisted living thereafter       LOS: 5 days   Clifford Dubonnet, MD 06/27/2012, 7:32 AM

## 2012-06-27 NOTE — Progress Notes (Signed)
Physical Therapy Treatment Patient Details Name: Clifford Spencer MRN: [id number] DOB: 03/28/1925 Today's Date: 06/27/2012 Time: 0865-7846 PT Time Calculation (min): 26 min  PT Assessment / Plan / Recommendation Comments on Treatment Session  Amb pt with RW this session in which pt demon increased steadyness/safety.  Pt stated, "I have one of these at home but rarely use it".  Advised pt of the benefits esp due to low BERG balance score.      Follow Up Recommendations  SNF;Supervision/Assistance - 24 hour     Does the patient have the potential to tolerate intense rehabilitation     Barriers to Discharge        Equipment Recommendations       Recommendations for Other Services    Frequency     Plan      Precautions / Restrictions Precautions Precautions: Fall Precaution Comments: pt states history of balance disorder/tremors   Pertinent Vitals/Pain No c/o pain    Mobility  Bed Mobility Bed Mobility: Supine to Sit;Sit to Supine Supine to Sit: 6: Modified independent (Device/Increase time) Sit to Supine: 6: Modified independent (Device/Increase time) Details for Bed Mobility Assistance: increased time Transfers Transfers: Sit to Stand;Stand to Sit Sit to Stand: From bed;5: Supervision Stand to Sit: 5: Supervision;To bed Details for Transfer Assistance: increased time and mild unstaedyness with backward gait Ambulation/Gait Ambulation/Gait Assistance: 4: Min guard Ambulation Distance (Feet): 385 Feet Assistive device: Rolling walker Ambulation/Gait Assistance Details: This time amb with RW in which pt demon increased balance/safety.  Pt stated, "I have one of these at home but I rearly use it".  Advised pt benefits. Gait Pattern: Step-through pattern;Decreased stride length;Narrow base of support;Shuffle Gait velocity: decreased     PT Goals                                progressing    Visit Information  Last PT Received On: 06/27/12 Assistance Needed: +1     Subjective Data      Cognition       Balance     End of Session PT - End of Session Equipment Utilized During Treatment: Gait belt Activity Tolerance: Patient tolerated treatment well Patient left: in bed;with bed alarm set;with call bell/phone within reach   Felecia Shelling  PTA [facility]  Acute  Rehab Pager      [phone]

## 2012-06-29 LAB — CULTURE, BLOOD (ROUTINE X 2): Culture: NO GROWTH

## 2013-10-10 ENCOUNTER — Emergency Department (HOSPITAL_COMMUNITY): Payer: Medicare Other

## 2013-10-10 ENCOUNTER — Encounter (HOSPITAL_COMMUNITY): Payer: Self-pay | Admitting: Emergency Medicine

## 2013-10-10 ENCOUNTER — Inpatient Hospital Stay (HOSPITAL_COMMUNITY)
Admission: EM | Admit: 2013-10-10 | Discharge: 2013-10-15 | DRG: 393 | Disposition: A | Discharge status: Skilled Nursing Facility | Payer: Medicare Other | Attending: Internal Medicine | Admitting: Internal Medicine

## 2013-10-10 DIAGNOSIS — G9340 Encephalopathy, unspecified: Secondary | ICD-10-CM

## 2013-10-10 DIAGNOSIS — T45515A Adverse effect of anticoagulants, initial encounter: Secondary | ICD-10-CM

## 2013-10-10 DIAGNOSIS — Z7982 Long term (current) use of aspirin: Secondary | ICD-10-CM

## 2013-10-10 DIAGNOSIS — Z951 Presence of aortocoronary bypass graft: Secondary | ICD-10-CM

## 2013-10-10 DIAGNOSIS — IMO0002 Reserved for concepts with insufficient information to code with codable children: Secondary | ICD-10-CM

## 2013-10-10 DIAGNOSIS — N400 Enlarged prostate without lower urinary tract symptoms: Secondary | ICD-10-CM | POA: Diagnosis present

## 2013-10-10 DIAGNOSIS — Z66 Do not resuscitate: Secondary | ICD-10-CM | POA: Diagnosis present

## 2013-10-10 DIAGNOSIS — IMO0001 Reserved for inherently not codable concepts without codable children: Secondary | ICD-10-CM

## 2013-10-10 DIAGNOSIS — S3690XA Unspecified injury of unspecified intra-abdominal organ, initial encounter: Secondary | ICD-10-CM | POA: Diagnosis not present

## 2013-10-10 DIAGNOSIS — Z79899 Other long term (current) drug therapy: Secondary | ICD-10-CM

## 2013-10-10 DIAGNOSIS — N390 Urinary tract infection, site not specified: Secondary | ICD-10-CM

## 2013-10-10 DIAGNOSIS — J189 Pneumonia, unspecified organism: Secondary | ICD-10-CM

## 2013-10-10 DIAGNOSIS — W06XXXA Fall from bed, initial encounter: Secondary | ICD-10-CM | POA: Diagnosis present

## 2013-10-10 DIAGNOSIS — N179 Acute kidney failure, unspecified: Secondary | ICD-10-CM

## 2013-10-10 DIAGNOSIS — D509 Iron deficiency anemia, unspecified: Secondary | ICD-10-CM | POA: Diagnosis present

## 2013-10-10 DIAGNOSIS — N4889 Other specified disorders of penis: Secondary | ICD-10-CM

## 2013-10-10 DIAGNOSIS — R195 Other fecal abnormalities: Secondary | ICD-10-CM

## 2013-10-10 DIAGNOSIS — S3692XA Contusion of unspecified intra-abdominal organ, initial encounter: Secondary | ICD-10-CM

## 2013-10-10 DIAGNOSIS — Z9181 History of falling: Secondary | ICD-10-CM

## 2013-10-10 DIAGNOSIS — R319 Hematuria, unspecified: Secondary | ICD-10-CM

## 2013-10-10 DIAGNOSIS — R1012 Left upper quadrant pain: Secondary | ICD-10-CM | POA: Diagnosis not present

## 2013-10-10 DIAGNOSIS — S51011A Laceration without foreign body of right elbow, initial encounter: Secondary | ICD-10-CM

## 2013-10-10 DIAGNOSIS — E785 Hyperlipidemia, unspecified: Secondary | ICD-10-CM | POA: Diagnosis present

## 2013-10-10 DIAGNOSIS — R7881 Bacteremia: Secondary | ICD-10-CM

## 2013-10-10 DIAGNOSIS — M129 Arthropathy, unspecified: Secondary | ICD-10-CM | POA: Diagnosis present

## 2013-10-10 DIAGNOSIS — F0390 Unspecified dementia without behavioral disturbance: Secondary | ICD-10-CM | POA: Diagnosis present

## 2013-10-10 DIAGNOSIS — I2510 Atherosclerotic heart disease of native coronary artery without angina pectoris: Secondary | ICD-10-CM | POA: Diagnosis present

## 2013-10-10 DIAGNOSIS — D6859 Other primary thrombophilia: Secondary | ICD-10-CM

## 2013-10-10 DIAGNOSIS — Y9210 Unspecified residential institution as the place of occurrence of the external cause: Secondary | ICD-10-CM | POA: Diagnosis present

## 2013-10-10 DIAGNOSIS — N19 Unspecified kidney failure: Secondary | ICD-10-CM

## 2013-10-10 DIAGNOSIS — K219 Gastro-esophageal reflux disease without esophagitis: Secondary | ICD-10-CM | POA: Diagnosis present

## 2013-10-10 DIAGNOSIS — Z7901 Long term (current) use of anticoagulants: Secondary | ICD-10-CM

## 2013-10-10 DIAGNOSIS — D62 Acute posthemorrhagic anemia: Secondary | ICD-10-CM | POA: Diagnosis present

## 2013-10-10 DIAGNOSIS — I10 Essential (primary) hypertension: Secondary | ICD-10-CM | POA: Diagnosis present

## 2013-10-10 DIAGNOSIS — D72829 Elevated white blood cell count, unspecified: Secondary | ICD-10-CM | POA: Diagnosis present

## 2013-10-10 DIAGNOSIS — F411 Generalized anxiety disorder: Secondary | ICD-10-CM | POA: Diagnosis present

## 2013-10-10 DIAGNOSIS — I4891 Unspecified atrial fibrillation: Secondary | ICD-10-CM | POA: Diagnosis present

## 2013-10-10 DIAGNOSIS — D6832 Hemorrhagic disorder due to extrinsic circulating anticoagulants: Secondary | ICD-10-CM

## 2013-10-10 DIAGNOSIS — E876 Hypokalemia: Secondary | ICD-10-CM | POA: Diagnosis present

## 2013-10-10 DIAGNOSIS — E119 Type 2 diabetes mellitus without complications: Secondary | ICD-10-CM | POA: Diagnosis present

## 2013-10-10 DIAGNOSIS — R791 Abnormal coagulation profile: Secondary | ICD-10-CM | POA: Diagnosis present

## 2013-10-10 DIAGNOSIS — R4189 Other symptoms and signs involving cognitive functions and awareness: Secondary | ICD-10-CM

## 2013-10-10 DIAGNOSIS — I482 Chronic atrial fibrillation, unspecified: Secondary | ICD-10-CM

## 2013-10-10 DIAGNOSIS — E860 Dehydration: Secondary | ICD-10-CM | POA: Diagnosis present

## 2013-10-10 DIAGNOSIS — E43 Unspecified severe protein-calorie malnutrition: Secondary | ICD-10-CM | POA: Diagnosis present

## 2013-10-10 DIAGNOSIS — R197 Diarrhea, unspecified: Secondary | ICD-10-CM

## 2013-10-10 DIAGNOSIS — S3692XS Contusion of unspecified intra-abdominal organ, sequela: Secondary | ICD-10-CM

## 2013-10-10 HISTORY — DX: Unspecified atrial fibrillation: I48.91

## 2013-10-10 LAB — CBC WITH DIFFERENTIAL/PLATELET
BASOS PCT: 0 % (ref 0–1)
Basophils Absolute: 0 10*3/uL (ref 0.0–0.1)
EOS ABS: 0 10*3/uL (ref 0.0–0.7)
EOS PCT: 0 % (ref 0–5)
HCT: 37.2 % — ABNORMAL LOW (ref 39.0–52.0)
Hemoglobin: 12.2 g/dL — ABNORMAL LOW (ref 13.0–17.0)
LYMPHS ABS: 1.1 10*3/uL (ref 0.7–4.0)
Lymphocytes Relative: 6 % — ABNORMAL LOW (ref 12–46)
MCH: 29.8 pg (ref 26.0–34.0)
MCHC: 32.8 g/dL (ref 30.0–36.0)
MCV: 90.7 fL (ref 78.0–100.0)
Monocytes Absolute: 1.8 10*3/uL — ABNORMAL HIGH (ref 0.1–1.0)
Monocytes Relative: 9 % (ref 3–12)
Neutro Abs: 17.1 10*3/uL — ABNORMAL HIGH (ref 1.7–7.7)
Neutrophils Relative %: 85 % — ABNORMAL HIGH (ref 43–77)
Platelets: 332 10*3/uL (ref 150–400)
RBC: 4.1 MIL/uL — ABNORMAL LOW (ref 4.22–5.81)
RDW: 15.9 % — ABNORMAL HIGH (ref 11.5–15.5)
WBC: 20 10*3/uL — ABNORMAL HIGH (ref 4.0–10.5)

## 2013-10-10 LAB — URINALYSIS, ROUTINE W REFLEX MICROSCOPIC
BILIRUBIN URINE: NEGATIVE
Glucose, UA: NEGATIVE mg/dL
Ketones, ur: NEGATIVE mg/dL
Leukocytes, UA: NEGATIVE
NITRITE: NEGATIVE
PH: 5.5 (ref 5.0–8.0)
Protein, ur: 100 mg/dL — AB
SPECIFIC GRAVITY, URINE: 1.017 (ref 1.005–1.030)
UROBILINOGEN UA: 0.2 mg/dL (ref 0.0–1.0)

## 2013-10-10 LAB — COMPREHENSIVE METABOLIC PANEL
ALBUMIN: 3.2 g/dL — AB (ref 3.5–5.2)
ALK PHOS: 100 U/L (ref 39–117)
ALT: 8 U/L (ref 0–53)
ANION GAP: 16 — AB (ref 5–15)
AST: 15 U/L (ref 0–37)
BUN: 53 mg/dL — AB (ref 6–23)
CALCIUM: 10 mg/dL (ref 8.4–10.5)
CO2: 24 mEq/L (ref 19–32)
Chloride: 101 mEq/L (ref 96–112)
Creatinine, Ser: 2.23 mg/dL — ABNORMAL HIGH (ref 0.50–1.35)
GFR calc Af Amer: 29 mL/min — ABNORMAL LOW (ref >90)
GFR calc non Af Amer: 25 mL/min — ABNORMAL LOW (ref >90)
GLUCOSE: 146 mg/dL — AB (ref 70–99)
Potassium: 3.8 mEq/L (ref 3.7–5.3)
SODIUM: 141 meq/L (ref 137–147)
Total Bilirubin: 0.3 mg/dL (ref 0.3–1.2)
Total Protein: 7.8 g/dL (ref 6.0–8.3)

## 2013-10-10 LAB — URINE MICROSCOPIC-ADD ON

## 2013-10-10 MED ORDER — IOHEXOL 300 MG/ML  SOLN: 50.0000 mL | Freq: Once | INTRAMUSCULAR | Status: AC | PRN

## 2013-10-10 MED ORDER — SODIUM CHLORIDE 0.9 % IV BOLUS (SEPSIS)
500.0000 mL | Freq: Once | INTRAVENOUS | Status: AC
  Administered 2013-10-10: 500 mL via INTRAVENOUS

## 2013-10-10 NOTE — ED Notes (Signed)
Per Rodney Booze in lab, ProtimeINR added to blood collected and saved at 2135hrs

## 2013-10-10 NOTE — ED Provider Notes (Signed)
CSN: [id number]     Arrival date & time 10/10/13  2023 History   First MD Initiated Contact with Patient 10/10/13 2059     Chief Complaint  Patient presents with  . Fall    Patient rolled out of bed and has a skin tear on left elbow. Patient said he might have hit his head on the rail but no skin tears or bruising. Patient has been having left upper quadrant pain for two days.     (Consider location/radiation/quality/duration/timing/severity/associated sxs/prior Treatment) HPI  Patient presents with fall while attempting to get out of bed today.  States he had to go to the bathroom and was in too much of a hurry to use his walker and fell out of bed.  Denies any pain from the fall.  He is bleeding from his right elbow.  Notes two days of left sided adbominal pain.  States he vomited today.  He is unable to comment about bowel movements. States he is not constipated.   Level V caveat for dementia.   Past Medical History  Diagnosis Date  . Coronary artery disease   . UTI (lower urinary tract infection)   . GERD (gastroesophageal reflux disease)   . Arthritis   . Dementia   . Hypertension   . BPH (benign prostatic hyperplasia)   . Anxiety   . Sciatica   . Hyperlipidemia   . Renal calculi   . Urinary frequency   . Urge incontinence   . DJD (degenerative joint disease), lumbar   . Microscopic hematuria   . CAD (coronary artery disease)   . Urethral bleeding    Past Surgical History  Procedure Laterality Date  . Back surgery    . Cardiac surgery    . Coronary artery bypass graft    . Prostate biopsy     Family History  Problem Relation Age of Onset  . Breast cancer Mother    History  Substance Use Topics  . Smoking status: Never Smoker   . Smokeless tobacco: Not on file  . Alcohol Use: No    Review of Systems  Unable to perform ROS: Dementia      Allergies  Review of patient's allergies indicates no known allergies.  Home Medications   Prior to Admission  medications   Medication Sig Start Date End Date Taking? Authorizing Provider  acetaminophen (TYLENOL) 325 MG tablet Take 650 mg by mouth every 4 (four) hours as needed for mild pain.   Yes Historical Provider, MD  amLODipine (NORVASC) 2.5 MG tablet Take 2.5 mg by mouth daily.   Yes Historical Provider, MD  aspirin 325 MG tablet Take 650 mg by mouth daily.   Yes Historical Provider, MD  calcium carbonate (TUMS - DOSED IN MG ELEMENTAL CALCIUM) 500 MG chewable tablet Chew 1 tablet by mouth every 4 (four) hours as needed for indigestion or heartburn.   Yes Historical Provider, MD  Cholecalciferol (VITAMIN D3) 5000 UNITS TABS Take 1 tablet by mouth daily.   Yes Historical Provider, MD  Coenzyme Q10 (COQ10) 200 MG CAPS Take 1 capsule by mouth at bedtime.   Yes Historical Provider, MD  docusate sodium (COLACE) 100 MG capsule Take 100 mg by mouth 2 (two) times daily.   Yes Historical Provider, MD  ferrous sulfate 325 (65 FE) MG tablet Take 325 mg by mouth daily with breakfast.   Yes Historical Provider, MD  gemfibrozil (LOPID) 600 MG tablet Take 600 mg by mouth 2 (two) times daily before a  meal.   Yes Historical Provider, MD  labetalol (NORMODYNE) 100 MG tablet Take 100 mg by mouth 2 (two) times daily.   Yes Historical Provider, MD  lactobacillus acidophilus (BACID) TABS tablet Take 2 tablets by mouth 2 (two) times daily.   Yes Historical Provider, MD  magnesium oxide (MAG-OX) 400 MG tablet Take 400 mg by mouth 2 (two) times daily.   Yes Historical Provider, MD  metFORMIN (GLUCOPHAGE) 850 MG tablet Take 850 mg by mouth 2 (two) times daily with a meal.   Yes Historical Provider, MD  nitroGLYCERIN (NITROSTAT) 0.4 MG SL tablet Place 0.4 mg under the tongue every 5 (five) minutes as needed for chest pain.   Yes Historical Provider, MD  pantoprazole (PROTONIX) 20 MG tablet Take 10 mg by mouth daily.   Yes Historical Provider, MD  PARoxetine (PAXIL) 10 MG tablet Take 10 mg by mouth daily.   Yes Historical  Provider, MD  promethazine (PHENERGAN) 25 MG/ML injection Inject 25 mg into the muscle every 6 (six) hours as needed for nausea or vomiting.   Yes Historical Provider, MD  tamsulosin (FLOMAX) 0.4 MG CAPS Take 0.4 mg by mouth daily after breakfast.   Yes Historical Provider, MD  traMADol (ULTRAM) 50 MG tablet Take 50 mg by mouth 2 (two) times daily.   Yes Historical Provider, MD  traMADol (ULTRAM) 50 MG tablet Take 50 mg by mouth every 6 (six) hours as needed for moderate pain.   Yes Historical Provider, MD  warfarin (COUMADIN) 3 MG tablet Take 3 mg by mouth every evening.   Yes Historical Provider, MD   BP 140/71  Pulse 105  Temp(Src) 98 F (36.7 C) (Oral)  Resp 14  SpO2 94% Physical Exam  Nursing note and vitals reviewed. Constitutional: He appears well-developed and well-nourished. No distress.  HENT:  Head: Normocephalic and atraumatic.  Neck: Neck supple.  Cardiovascular: Normal rate and regular rhythm.   Pulmonary/Chest: Effort normal and breath sounds normal. No respiratory distress. He has no wheezes. He has no rales.  Abdominal: Soft. He exhibits no distension. There is tenderness in the left upper quadrant and left lower quadrant. There is no rebound and no guarding.  Musculoskeletal:       Legs: Neurological: He is alert. He exhibits normal muscle tone.  Oriented to self.  Thinks he is at [facility].  When asked the date or the year, responds with numbers "28,29" etc.    Skin: He is not diaphoretic.     Multiple areas of ecchymoses over hands and arms.    ED Course  Procedures (including critical care time) Labs Review Labs Reviewed  URINALYSIS, ROUTINE W REFLEX MICROSCOPIC - Abnormal; Notable for the following:    APPearance CLOUDY (*)    Hgb urine dipstick LARGE (*)    Protein, ur 100 (*)    All other components within normal limits  URINE MICROSCOPIC-ADD ON - Abnormal; Notable for the following:    Bacteria, UA FEW (*)    All other components within  normal limits  CBC WITH DIFFERENTIAL - Abnormal; Notable for the following:    WBC 20.0 (*)    RBC 4.10 (*)    Hemoglobin 12.2 (*)    HCT 37.2 (*)    RDW 15.9 (*)    Neutrophils Relative % 85 (*)    Neutro Abs 17.1 (*)    Lymphocytes Relative 6 (*)    Monocytes Absolute 1.8 (*)    All other components within normal limits  COMPREHENSIVE METABOLIC PANEL - Abnormal; Notable for the following:    Glucose, Bld 146 (*)    BUN 53 (*)    Creatinine, Ser 2.23 (*)    Albumin 3.2 (*)    GFR calc non Af Amer 25 (*)    GFR calc Af Amer 29 (*)    Anion gap 16 (*)    All other components within normal limits  PROTIME-INR - Abnormal; Notable for the following:    Prothrombin Time 76.3 (*)    INR 9.45 (*)    All other components within normal limits  PREPARE FRESH FROZEN PLASMA    Imaging Review Ct Abdomen Pelvis Wo Contrast  10/10/2013   CLINICAL DATA:  Larey Seat.  Left upper quadrant pain.  EXAM: CT ABDOMEN AND PELVIS WITHOUT CONTRAST  TECHNIQUE: Multidetector CT imaging of the abdomen and pelvis was performed following the standard protocol without IV contrast.  COMPARISON:  CT scan 02/26/2011  FINDINGS: The lung bases demonstrate dependent atelectasis and scarring. No pleural effusion. A calcified granuloma is noted at the left lung base. The heart is within normal limits in size. Coronary artery calcifications are noted. There is a moderate-sized hiatal hernia.  The large pancreatic cyst demonstrates evidence of interval hemorrhage with high attenuation material within the cyst measuring up to 70 Hounsfield units. This also hematoma/ hemorrhage in the left upper quadrant adjacent to the stomach and anterior to the spleen. I do not see a definite splenic rupture but recommend close followup.  The liver is unremarkable and stable. The gallbladder appears normal. No common bile duct dilatation. Moderate atrophy of the pancreatic head. The left adrenal gland demonstrates a small nodule which is likely a  benign adenoma. The kidneys demonstrate numerous cysts. Right renal calculi are noted.  The stomach, duodenum, small bowel and colon are grossly normal. No inflammatory changes, mass lesions or obstructive findings. No mesenteric or retroperitoneal mass or adenopathy. Stable atherosclerotic calcifications involving the aorta and branch vessels.  CT pelvis: The prostate gland is mildly enlarged with median lobe hypertrophy impressing on the base of the bladder. The bladder is normal. No pelvic mass, adenopathy or hematoma. No inguinal mass or adenopathy.  The bony structures are intact. Advanced osteoporosis and degenerative changes in the spine.  IMPRESSION: 1. Left upper quadrant hematoma likely due to a small bleeding omental or mesenteric vein. This is adjacent to the spleen but I do not see a definite splenic injury. Recommend close followup. There is also evidence of hemorrhage into the pancreatic cyst. 2. Multiple stable renal cysts and right renal calculi. 3. No definite left-sided rib fractures. 4. Stable prostate gland enlargement.   Electronically Signed   By: Loralie Champagne M.D.   On: 10/10/2013 23:14   Ct Head Wo Contrast  10/10/2013   CLINICAL DATA:  Rolled out of bed; concern for head or cervical spine injury.  EXAM: CT HEAD WITHOUT CONTRAST  CT CERVICAL SPINE WITHOUT CONTRAST  TECHNIQUE: Multidetector CT imaging of the head and cervical spine was performed following the standard protocol without intravenous contrast. Multiplanar CT image reconstructions of the cervical spine were also generated.  COMPARISON:  MRI of the brain performed 09/25/2013, CT of the head performed 11/13/2010, and CT of the cervical spine performed 09/19/2009  FINDINGS: CT HEAD FINDINGS  There is no evidence of acute infarction, mass lesion, or intra- or extra-axial hemorrhage on CT.  Prominence of the ventricles and sulci reflects mild to moderate cortical volume loss. Mild cerebellar atrophy is noted. Scattered  periventricular and subcortical white matter change likely reflects small vessel ischemic microangiopathy.  The brainstem and fourth ventricle are within normal limits. The basal ganglia are unremarkable in appearance. The cerebral hemispheres demonstrate grossly normal gray-white differentiation. No mass effect or midline shift is seen.  There is no evidence of fracture; visualized osseous structures are unremarkable in appearance. The orbits are within normal limits. The paranasal sinuses and mastoid air cells are well-aerated. No significant soft tissue abnormalities are seen.  CT CERVICAL SPINE FINDINGS  There is no evidence of fracture or subluxation. Mild vacuum phenomenon is noted along the cervical spine. Underlying facet disease is noted, more prominent on the right. Vertebral bodies demonstrate normal height and alignment. Intervertebral disc spaces are otherwise preserved. Prevertebral soft tissues are within normal limits.  The thyroid gland is unremarkable in appearance. The visualized lung apices are clear. Mild calcification is noted at the carotid bifurcations bilaterally.  IMPRESSION: 1. No evidence of traumatic intracranial injury or fracture. 2. No evidence of fracture or subluxation along the cervical spine. 3. Mild to moderate cortical volume loss and scattered small vessel ischemic microangiopathy. 4. Minimal degenerative change noted along the cervical spine. 5. Mild calcification at the carotid bifurcations bilaterally.   Electronically Signed   By: Roanna Raider M.D.   On: 10/10/2013 23:13   Ct Cervical Spine Wo Contrast  10/10/2013   CLINICAL DATA:  Rolled out of bed; concern for head or cervical spine injury.  EXAM: CT HEAD WITHOUT CONTRAST  CT CERVICAL SPINE WITHOUT CONTRAST  TECHNIQUE: Multidetector CT imaging of the head and cervical spine was performed following the standard protocol without intravenous contrast. Multiplanar CT image reconstructions of the cervical spine were also  generated.  COMPARISON:  MRI of the brain performed 09/25/2013, CT of the head performed 11/13/2010, and CT of the cervical spine performed 09/19/2009  FINDINGS: CT HEAD FINDINGS  There is no evidence of acute infarction, mass lesion, or intra- or extra-axial hemorrhage on CT.  Prominence of the ventricles and sulci reflects mild to moderate cortical volume loss. Mild cerebellar atrophy is noted. Scattered periventricular and subcortical white matter change likely reflects small vessel ischemic microangiopathy.  The brainstem and fourth ventricle are within normal limits. The basal ganglia are unremarkable in appearance. The cerebral hemispheres demonstrate grossly normal gray-white differentiation. No mass effect or midline shift is seen.  There is no evidence of fracture; visualized osseous structures are unremarkable in appearance. The orbits are within normal limits. The paranasal sinuses and mastoid air cells are well-aerated. No significant soft tissue abnormalities are seen.  CT CERVICAL SPINE FINDINGS  There is no evidence of fracture or subluxation. Mild vacuum phenomenon is noted along the cervical spine. Underlying facet disease is noted, more prominent on the right. Vertebral bodies demonstrate normal height and alignment. Intervertebral disc spaces are otherwise preserved. Prevertebral soft tissues are within normal limits.  The thyroid gland is unremarkable in appearance. The visualized lung apices are clear. Mild calcification is noted at the carotid bifurcations bilaterally.  IMPRESSION: 1. No evidence of traumatic intracranial injury or fracture. 2. No evidence of fracture or subluxation along the cervical spine. 3. Mild to moderate cortical volume loss and scattered small vessel ischemic microangiopathy. 4. Minimal degenerative change noted along the cervical spine. 5. Mild calcification at the carotid bifurcations bilaterally.   Electronically Signed   By: Roanna Raider M.D.   On: 10/10/2013  23:13     EKG Interpretation   Date/Time:  Sunday October 11 2013 00:05:16  EDT Ventricular Rate:  91 PR Interval:    QRS Duration: 93 QT Interval:  351 QTC Calculation: 432 R Axis:   70 Text Interpretation:  Atrial flutter with predominant 3:1 AV block  Ventricular bigeminy Confirmed by Wilkie Aye  MD, Toni Amend ([id number]) on  10/11/2013 12:11:49 AM      11:01 PM Dr Lynelle Doctor made aware of the patient.   11:47 PM Discussed pt with Dr Maisie Fus (general surgery) who recommends medical admission for monitoring, bed rest, hold anticoagulation, follow hemoglobin.    BUN/creat 10 and 1.1 on 09/29/13.   12:14 AM INR is 9.4.  Discussed with Dr Wilkie Aye.  It appears patient is on coumadin for afib. Will give FFP and Vitamin K per our disussion.     I have attempted to call facility without connection, phone number goes to voice mail.   CRITICAL CARE Performed by: Trixie Dredge   Total critical care time: 30 Critical care time was exclusive of separately billable procedures and treating other patients.  Critical care was necessary to treat or prevent imminent or life-threatening deterioration.  Critical care was time spent personally by me on the following activities: development of treatment plan with patient and/or surrogate as well as nursing, discussions with consultants, evaluation of patient's response to treatment, examination of patient, obtaining history from patient or surrogate, ordering and performing treatments and interventions, ordering and review of laboratory studies, ordering and review of radiographic studies, pulse oximetry and re-evaluation of patient's condition.   MDM   Final diagnoses:  Renal failure  Intra-abdominal hematoma, initial encounter  Hypercoagulopathy  Elevated INR  Fall from bed, initial encounter  Skin tear of elbow without complication, right, initial encounter    Afebrile nontoxic patient with multiple medical problems including dementia, on coumadin,  brought in from nursing home after fall from bed today.  Pt also noted to have abdominal pain x 2 days.  LUQ TTP.  CT scan shows LUQ intraabdominal hematoma, likely venous bleed.  INR 9.4.  Pt given FFP and Vitamin K Discussed intraabdominal bleed with general surgery Maisie Fus) who recommends bed rest and monitoring.  Admitted to [facility], Dr Allena Katz.     [place], PA-C 10/11/13 [phone]

## 2013-10-10 NOTE — ED Notes (Signed)
Patient rolled out of bed and has a skin tear on left elbow. Patient said he might have hit his head on the rail but no skin tears or bruising. Patient has been having left upper quadrant pain for two days. Patient stated he hit the floor and is currently not in pain. Patient stated he does that a lot.

## 2013-10-10 NOTE — ED Notes (Signed)
Bed: WA09 Expected date:  Expected time:  Means of arrival:  Comments: EMS 80yo N/V, fall, from [facility]

## 2013-10-11 ENCOUNTER — Encounter (HOSPITAL_COMMUNITY): Payer: Self-pay | Admitting: Emergency Medicine

## 2013-10-11 DIAGNOSIS — K219 Gastro-esophageal reflux disease without esophagitis: Secondary | ICD-10-CM | POA: Diagnosis not present

## 2013-10-11 DIAGNOSIS — S3690XA Unspecified injury of unspecified intra-abdominal organ, initial encounter: Secondary | ICD-10-CM | POA: Diagnosis present

## 2013-10-11 DIAGNOSIS — R1012 Left upper quadrant pain: Secondary | ICD-10-CM | POA: Diagnosis present

## 2013-10-11 DIAGNOSIS — D689 Coagulation defect, unspecified: Secondary | ICD-10-CM

## 2013-10-11 DIAGNOSIS — N179 Acute kidney failure, unspecified: Secondary | ICD-10-CM

## 2013-10-11 DIAGNOSIS — Z7982 Long term (current) use of aspirin: Secondary | ICD-10-CM | POA: Diagnosis not present

## 2013-10-11 DIAGNOSIS — R791 Abnormal coagulation profile: Secondary | ICD-10-CM

## 2013-10-11 DIAGNOSIS — I4891 Unspecified atrial fibrillation: Secondary | ICD-10-CM | POA: Diagnosis present

## 2013-10-11 DIAGNOSIS — D72829 Elevated white blood cell count, unspecified: Secondary | ICD-10-CM | POA: Diagnosis present

## 2013-10-11 DIAGNOSIS — Z9181 History of falling: Secondary | ICD-10-CM | POA: Diagnosis not present

## 2013-10-11 DIAGNOSIS — E119 Type 2 diabetes mellitus without complications: Secondary | ICD-10-CM | POA: Diagnosis present

## 2013-10-11 DIAGNOSIS — N400 Enlarged prostate without lower urinary tract symptoms: Secondary | ICD-10-CM | POA: Diagnosis present

## 2013-10-11 DIAGNOSIS — F411 Generalized anxiety disorder: Secondary | ICD-10-CM | POA: Diagnosis not present

## 2013-10-11 DIAGNOSIS — E876 Hypokalemia: Secondary | ICD-10-CM | POA: Diagnosis present

## 2013-10-11 DIAGNOSIS — Z7901 Long term (current) use of anticoagulants: Secondary | ICD-10-CM | POA: Diagnosis not present

## 2013-10-11 DIAGNOSIS — I10 Essential (primary) hypertension: Secondary | ICD-10-CM | POA: Diagnosis not present

## 2013-10-11 DIAGNOSIS — Z66 Do not resuscitate: Secondary | ICD-10-CM | POA: Diagnosis present

## 2013-10-11 DIAGNOSIS — T45515A Adverse effect of anticoagulants, initial encounter: Secondary | ICD-10-CM

## 2013-10-11 DIAGNOSIS — M129 Arthropathy, unspecified: Secondary | ICD-10-CM | POA: Diagnosis not present

## 2013-10-11 DIAGNOSIS — F0390 Unspecified dementia without behavioral disturbance: Secondary | ICD-10-CM | POA: Diagnosis present

## 2013-10-11 DIAGNOSIS — Z951 Presence of aortocoronary bypass graft: Secondary | ICD-10-CM | POA: Diagnosis not present

## 2013-10-11 DIAGNOSIS — Z79899 Other long term (current) drug therapy: Secondary | ICD-10-CM | POA: Diagnosis not present

## 2013-10-11 DIAGNOSIS — IMO0002 Reserved for concepts with insufficient information to code with codable children: Secondary | ICD-10-CM | POA: Diagnosis not present

## 2013-10-11 DIAGNOSIS — R319 Hematuria, unspecified: Secondary | ICD-10-CM | POA: Diagnosis present

## 2013-10-11 DIAGNOSIS — Y9210 Unspecified residential institution as the place of occurrence of the external cause: Secondary | ICD-10-CM | POA: Diagnosis present

## 2013-10-11 DIAGNOSIS — W06XXXA Fall from bed, initial encounter: Secondary | ICD-10-CM | POA: Diagnosis present

## 2013-10-11 DIAGNOSIS — D6832 Hemorrhagic disorder due to extrinsic circulating anticoagulants: Secondary | ICD-10-CM | POA: Diagnosis present

## 2013-10-11 DIAGNOSIS — E785 Hyperlipidemia, unspecified: Secondary | ICD-10-CM | POA: Diagnosis not present

## 2013-10-11 DIAGNOSIS — D509 Iron deficiency anemia, unspecified: Secondary | ICD-10-CM | POA: Diagnosis present

## 2013-10-11 DIAGNOSIS — E860 Dehydration: Secondary | ICD-10-CM

## 2013-10-11 DIAGNOSIS — I2510 Atherosclerotic heart disease of native coronary artery without angina pectoris: Secondary | ICD-10-CM | POA: Diagnosis not present

## 2013-10-11 DIAGNOSIS — Z87440 Personal history of urinary (tract) infections: Secondary | ICD-10-CM | POA: Diagnosis not present

## 2013-10-11 DIAGNOSIS — E43 Unspecified severe protein-calorie malnutrition: Secondary | ICD-10-CM | POA: Diagnosis present

## 2013-10-11 DIAGNOSIS — S3692XA Contusion of unspecified intra-abdominal organ, initial encounter: Secondary | ICD-10-CM

## 2013-10-11 LAB — PROTIME-INR
INR: 9.45 (ref 0.00–1.49)
INR: 1.76 — ABNORMAL HIGH (ref 0.00–1.49)
Prothrombin Time: 76.3 seconds — ABNORMAL HIGH (ref 11.6–15.2)
Prothrombin Time: 20.5 seconds — ABNORMAL HIGH (ref 11.6–15.2)

## 2013-10-11 LAB — MAGNESIUM: MAGNESIUM: 2.1 mg/dL (ref 1.5–2.5)

## 2013-10-11 LAB — BASIC METABOLIC PANEL
Anion gap: 18 — ABNORMAL HIGH (ref 5–15)
BUN: 45 mg/dL — ABNORMAL HIGH (ref 6–23)
CO2: 23 mEq/L (ref 19–32)
Calcium: 9.2 mg/dL (ref 8.4–10.5)
Chloride: 102 mEq/L (ref 96–112)
Creatinine, Ser: 1.92 mg/dL — ABNORMAL HIGH (ref 0.50–1.35)
GFR calc non Af Amer: 30 mL/min — ABNORMAL LOW (ref >90)
GFR, EST AFRICAN AMERICAN: 35 mL/min — AB (ref >90)
Glucose, Bld: 159 mg/dL — ABNORMAL HIGH (ref 70–99)
POTASSIUM: 3.5 meq/L — AB (ref 3.7–5.3)
SODIUM: 143 meq/L (ref 137–147)

## 2013-10-11 LAB — GLUCOSE, CAPILLARY
GLUCOSE-CAPILLARY: 127 mg/dL — AB (ref 70–99)
GLUCOSE-CAPILLARY: 178 mg/dL — AB (ref 70–99)
Glucose-Capillary: 153 mg/dL — ABNORMAL HIGH (ref 70–99)
Glucose-Capillary: 136 mg/dL — ABNORMAL HIGH (ref 70–99)

## 2013-10-11 LAB — HEPATIC FUNCTION PANEL
ALBUMIN: 3.1 g/dL — AB (ref 3.5–5.2)
ALT: 7 U/L (ref 0–53)
AST: 14 U/L (ref 0–37)
Alkaline Phosphatase: 83 U/L (ref 39–117)
Bilirubin, Direct: <0.2 mg/dL (ref 0.0–0.3)
TOTAL PROTEIN: 7.2 g/dL (ref 6.0–8.3)
Total Bilirubin: 0.5 mg/dL (ref 0.3–1.2)

## 2013-10-11 LAB — CBC
HCT: 29.7 % — ABNORMAL LOW (ref 39.0–52.0)
Hemoglobin: 9.8 g/dL — ABNORMAL LOW (ref 13.0–17.0)
MCH: 29.9 pg (ref 26.0–34.0)
MCHC: 33 g/dL (ref 30.0–36.0)
MCV: 90.5 fL (ref 78.0–100.0)
Platelets: 199 10*3/uL (ref 150–400)
RBC: 3.28 MIL/uL — ABNORMAL LOW (ref 4.22–5.81)
RDW: 15.9 % — ABNORMAL HIGH (ref 11.5–15.5)
WBC: 14.4 10*3/uL — ABNORMAL HIGH (ref 4.0–10.5)

## 2013-10-11 LAB — TYPE AND SCREEN
ABO/RH(D): O NEG
ANTIBODY SCREEN: NEGATIVE

## 2013-10-11 LAB — PHOSPHORUS: PHOSPHORUS: 2.7 mg/dL (ref 2.3–4.6)

## 2013-10-11 LAB — MRSA PCR SCREENING: MRSA BY PCR: POSITIVE — AB

## 2013-10-11 LAB — ABO/RH: ABO/RH(D): O NEG

## 2013-10-11 MED ORDER — HYDRALAZINE HCL 20 MG/ML IJ SOLN: 10.0000 mg | Freq: Four times a day (QID) | INTRAMUSCULAR | Status: DC | PRN

## 2013-10-11 MED ORDER — ONDANSETRON HCL 4 MG PO TABS: 4.0000 mg | ORAL_TABLET | Freq: Four times a day (QID) | ORAL | Status: DC | PRN

## 2013-10-11 MED ORDER — AMLODIPINE BESYLATE 5 MG PO TABS: 5.0000 mg | ORAL_TABLET | Freq: Every day | ORAL | Status: DC

## 2013-10-11 MED ORDER — PANTOPRAZOLE SODIUM 40 MG PO TBEC
40.0000 mg | DELAYED_RELEASE_TABLET | Freq: Two times a day (BID) | ORAL | Status: DC
  Administered 2013-10-11 – 2013-10-15 (×9): 40 mg via ORAL
  Filled 2013-10-11 (×11): qty 1

## 2013-10-11 MED ORDER — SODIUM CHLORIDE 0.9 % IV SOLN
INTRAVENOUS | Status: DC
  Administered 2013-10-11: 06:00:00 via INTRAVENOUS
  Administered 2013-10-11: 50 mL/h via INTRAVENOUS
  Administered 2013-10-14 (×2): via INTRAVENOUS

## 2013-10-11 MED ORDER — POTASSIUM CHLORIDE CRYS ER 20 MEQ PO TBCR
40.0000 meq | EXTENDED_RELEASE_TABLET | Freq: Once | ORAL | Status: AC
  Administered 2013-10-11: 40 meq via ORAL
  Filled 2013-10-11: qty 2

## 2013-10-11 MED ORDER — LABETALOL HCL 100 MG PO TABS
100.0000 mg | ORAL_TABLET | Freq: Two times a day (BID) | ORAL | Status: DC
  Administered 2013-10-11 – 2013-10-15 (×9): 100 mg via ORAL
  Filled 2013-10-11 (×10): qty 1

## 2013-10-11 MED ORDER — TRAMADOL HCL 50 MG PO TABS
50.0000 mg | ORAL_TABLET | Freq: Four times a day (QID) | ORAL | Status: DC | PRN
  Administered 2013-10-11 (×2): 50 mg via ORAL
  Filled 2013-10-11 (×2): qty 1

## 2013-10-11 MED ORDER — DOCUSATE SODIUM 100 MG PO CAPS
100.0000 mg | ORAL_CAPSULE | Freq: Two times a day (BID) | ORAL | Status: DC
  Administered 2013-10-11 – 2013-10-15 (×9): 100 mg via ORAL
  Filled 2013-10-11 (×10): qty 1

## 2013-10-11 MED ORDER — PAROXETINE HCL 10 MG PO TABS
10.0000 mg | ORAL_TABLET | Freq: Every day | ORAL | Status: DC
  Administered 2013-10-11 – 2013-10-15 (×5): 10 mg via ORAL
  Filled 2013-10-11 (×5): qty 1

## 2013-10-11 MED ORDER — SODIUM CHLORIDE 0.9 % IV SOLN: Freq: Once | INTRAVENOUS | Status: DC

## 2013-10-11 MED ORDER — PHYTONADIONE 5 MG PO TABS
10.0000 mg | ORAL_TABLET | Freq: Once | ORAL | Status: DC
  Filled 2013-10-11: qty 2

## 2013-10-11 MED ORDER — INSULIN ASPART 100 UNIT/ML ~~LOC~~ SOLN
0.0000 [IU] | Freq: Four times a day (QID) | SUBCUTANEOUS | Status: DC
  Administered 2013-10-11: 2 [IU] via SUBCUTANEOUS
  Administered 2013-10-11: 1 [IU] via SUBCUTANEOUS

## 2013-10-11 MED ORDER — FERROUS SULFATE 325 (65 FE) MG PO TABS
325.0000 mg | ORAL_TABLET | Freq: Every day | ORAL | Status: DC
  Administered 2013-10-11 – 2013-10-15 (×5): 325 mg via ORAL
  Filled 2013-10-11 (×6): qty 1

## 2013-10-11 MED ORDER — INSULIN ASPART 100 UNIT/ML ~~LOC~~ SOLN
0.0000 [IU] | Freq: Four times a day (QID) | SUBCUTANEOUS | Status: DC
  Administered 2013-10-11: 1 [IU] via SUBCUTANEOUS
  Administered 2013-10-11 – 2013-10-12 (×2): 2 [IU] via SUBCUTANEOUS
  Administered 2013-10-12: 1 [IU] via SUBCUTANEOUS
  Administered 2013-10-12: 2 [IU] via SUBCUTANEOUS

## 2013-10-11 MED ORDER — ONDANSETRON HCL 4 MG/2ML IJ SOLN
4.0000 mg | Freq: Four times a day (QID) | INTRAMUSCULAR | Status: DC | PRN
  Administered 2013-10-11 – 2013-10-14 (×2): 4 mg via INTRAVENOUS
  Filled 2013-10-11 (×2): qty 2

## 2013-10-11 MED ORDER — VITAMIN K1 10 MG/ML IJ SOLN
10.0000 mg | Freq: Once | INTRAVENOUS | Status: DC
  Filled 2013-10-11: qty 1

## 2013-10-11 MED ORDER — AMLODIPINE BESYLATE 5 MG PO TABS
2.5000 mg | ORAL_TABLET | Freq: Every day | ORAL | Status: DC
  Administered 2013-10-11: 2.5 mg via ORAL
  Filled 2013-10-11: qty 1

## 2013-10-11 MED ORDER — ACETAMINOPHEN 325 MG PO TABS: 650.0000 mg | ORAL_TABLET | Freq: Four times a day (QID) | ORAL | Status: DC | PRN

## 2013-10-11 MED ORDER — TAMSULOSIN HCL 0.4 MG PO CAPS
0.4000 mg | ORAL_CAPSULE | Freq: Every day | ORAL | Status: DC
  Administered 2013-10-11 – 2013-10-15 (×5): 0.4 mg via ORAL
  Filled 2013-10-11 (×5): qty 1

## 2013-10-11 MED ORDER — SODIUM CHLORIDE 0.9 % IV SOLN: INTRAVENOUS | Status: AC

## 2013-10-11 MED ORDER — MORPHINE SULFATE 2 MG/ML IJ SOLN: 1.0000 mg | INTRAMUSCULAR | Status: DC | PRN

## 2013-10-11 MED ORDER — ACETAMINOPHEN 650 MG RE SUPP: 650.0000 mg | Freq: Four times a day (QID) | RECTAL | Status: DC | PRN

## 2013-10-11 MED ORDER — PHYTONADIONE 5 MG PO TABS
10.0000 mg | ORAL_TABLET | Freq: Once | ORAL | Status: AC
  Administered 2013-10-11: 10 mg via ORAL
  Filled 2013-10-11: qty 2

## 2013-10-11 NOTE — Progress Notes (Addendum)
INITIAL NUTRITION ASSESSMENT  DOCUMENTATION CODES Per approved criteria  -Severe malnutrition in the context of chronic illness   INTERVENTION: Recommend SLP evaluation prior to initiation of PO's --> per SNF records pt was previously on a Regular diet with Nectar Thickened Liquids. RD alerted RN. RD to continue to follow nutrition care plan, pt would benefit from addition of oral nutrition supplements.  NUTRITION DIAGNOSIS: Increased nutrient needs related to healing as evidenced by recent falls and estimated nutrition needs.   Goal: Diet advancement. Intake to meet at least 90% of nutrition needs.  Monitor:  weight trends, lab trends, I/O's, diet advancement, SLP recommendations, need for oral nutrition supplements  Reason for Assessment: Malnutrition Screening Tool  80 y.o. male  Admitting Dx: Intra-abdominal hematoma  ASSESSMENT: PMHx significant for CAD, afib, BPH, dementia, GERD, HTN. Admitted s/p fall after trying to go to the bathroom. Work-up reveals intra-abdominal hematoma.  Per chart - pt with ankle fracture about 3 weeks ago. Pt is currently NPO. Reviewed SNF medical record - pt was previously receiving Regular diet with Nectar Liquids -- alerted RN.  Consult for surgery pending at this time.  Nutrition Focused Physical Exam:  Subcutaneous Fat:  Orbital Region: WNL Upper Arm Region: severe depletion Thoracic and Lumbar Region: moderate depletion  Muscle:  Temple Region: moderate depletion Clavicle Bone Region: severe depletion Clavicle and Acromion Bone Region: moderate depletion Scapular Bone Region: n/a Dorsal Hand: n/a Patellar Region: moderate depletion Anterior Thigh Region: severe depletion Posterior Calf Region: severe depletion  Edema: none  Pt meets criteria for severe MALNUTRITION in the context of chronic illness as evidenced by severe fat and muscle mass loss.  CBG's: 127 - 153   Height: Ht Readings from Last 1 Encounters:  10/11/13   (1.676 m)    Weight: Wt Readings from Last 1 Encounters:  10/11/13 148 lb 9.4 oz (67.4 kg)    Ideal Body Weight: 141 lb  % Ideal Body Weight: 104%  Wt Readings from Last 10 Encounters:  10/11/13 148 lb 9.4 oz (67.4 kg)  06/27/12 155 lb 8 oz (70.534 kg)    Usual Body Weight: 155 lb  % Usual Body Weight: 95%  BMI:  Body mass index is 23.99 kg/(m^2). WNL  Estimated Nutritional Needs: Kcal: 1600 - 1750 Protein: 70 - 80 g Fluid: 1.8 - 2 liters  Skin: intact  Diet Order: NPO  EDUCATION NEEDS: -Education not appropriate at this time   Intake/Output Summary (Last 24 hours) at 10/11/13 0839 Last data filed at 10/11/13 0814  Gross per 24 hour  Intake    929 ml  Output    425 ml  Net    504 ml    Last BM: PTA  Labs:   Recent Labs Lab 10/10/13 2135  NA 141  K 3.8  CL 101  CO2 24  BUN 53*  CREATININE 2.23*  CALCIUM 10.0  GLUCOSE 146*    CBG (last 3)   Recent Labs  10/11/13 0151 10/11/13 0612  GLUCAP 153* 127*    Scheduled Meds: . sodium chloride   Intravenous STAT  . sodium chloride   Intravenous Once  . amLODipine  2.5 mg Oral Daily  . docusate sodium  100 mg Oral BID  . ferrous sulfate  325 mg Oral QPC breakfast  . insulin aspart  0-9 Units Subcutaneous Q6H  . labetalol  100 mg Oral BID  . pantoprazole  40 mg Oral BID AC  . PARoxetine  10 mg Oral Daily  .  tamsulosin  0.4 mg Oral Daily    Continuous Infusions: . sodium chloride 50 mL/hr at 10/11/13 0981    Past Medical History  Diagnosis Date  . Coronary artery disease   . UTI (lower urinary tract infection)   . GERD (gastroesophageal reflux disease)   . Arthritis   . Dementia   . Hypertension   . BPH (benign prostatic hyperplasia)   . Anxiety   . Sciatica   . Hyperlipidemia   . Renal calculi   . Urinary frequency   . Urge incontinence   . DJD (degenerative joint disease), lumbar   . Microscopic hematuria   . CAD (coronary artery disease)   . Urethral bleeding   .  A-fib     Past Surgical History  Procedure Laterality Date  . Back surgery    . Cardiac surgery    . Coronary artery bypass graft    . Prostate biopsy      Jarold Motto MS, RD, LDN Inpatient Registered Dietitian Pager: [phone] After-hours pager: [phone]

## 2013-10-11 NOTE — ED Provider Notes (Signed)
Medical screening examination/treatment/procedure(s) were conducted as a shared visit with non-physician practitioner(s) and myself.  I personally evaluated the patient during the encounter.   EKG Interpretation   Date/Time:  Sunday October 11 2013 00:05:16 EDT Ventricular Rate:  91 PR Interval:    QRS Duration: 93 QT Interval:  351 QTC Calculation: 432 R Axis:   70 Text Interpretation:  Atrial flutter with predominant 3:1 AV block  Ventricular bigeminy Confirmed by HORTON  MD, COURTNEY ([id number]) on  10/11/2013 12:11:49 AM      Pt presented to the ED after a fall at the nursing home.  On coumadin.  CT scan showed an intraabdominal hematoma.  INR is elevated to 9.4.  No active bleeding and hemodynamically stable. Treated with FFP and vit k.  Hold on KCENTRA  Pt remained stable in the ED.  Pain was mild.  Admitted for further treatment and monitoring.  Linwood Dibbles, MD 10/11/13 [phone]

## 2013-10-11 NOTE — Progress Notes (Addendum)
[facility] PROGRESS NOTE  Clifford Spencer UJW:[id number] DOB: 1925/12/20 DOA: 10/10/2013 PCP: No primary provider on file.   Brief narrative 80 year old male with history of coronary artery disease, A. fib on Coumadin, BPH, dementia, GERD, hypertension placement of skilled nursing facility was brought to the ED after he had a fall while going to the bathroom. He also reported having left-sided abdominal pain since past 2 days and also had an episode of vomiting on the day of admission. Patient was found to have left upper quadrant intra-abdominal hematoma on CT scan with supratherapeutic INR of 9.4. General surgery was consulted who recommended bedrest and close monitoring and patient was admitted to step down under hospitalist service.    Assessment/Plan: intra-abdominal hematoma Reportedly patient sustained a fall 2 days back and was having some abdominal pain since then. CT scan with left vocal cord and hematoma likely venous bleeding without any splenic injury the. Also). The pseudocyst with hemorrhage. CT scan of the head and C-spine were negative for any acute injury.\ -Patient given FFP and vitamin K and his INR has improved to 1.76 this morning. We'll hold Coumadin and given possible frequent fall risk I would discontinue Coumadin completely. -Continue with bedrest with serial abdominal exam. Pain control as needed. -Noted for dropping hemoglobin this morning at his baseline hemoglobin is around 10-11. Monitor closely. Does not need transfusion at this time. Monitor INR closely. -Hold aspirin -Pain control with Toradol and low dose morphine.  Acute kidney injury Possibly secondary to dehydration. 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 Continue with IV hydration and monitor. He will function slowly improved this morning.  Leukocytosis Possibly reactive/ chest induced. No signs of infection. Monitor in am  Iron deficiency anemia Baseline  hemoglobin of 10-11. Continue iron supplements  Coronary artery disease  Hold aspirin. Continue lopid   Uncontrolled hypertension Resume all home blood pressure medications. Add when necessary hydralazine  Diabetes mellitus Place on sliding scale insulin.  GERD Continue PPI  BPH Continue Flomax.    Code Status: DO NOT RESUSCITATE Family Communication: None at bedside. He reports having no close family Disposition Plan: Return to skilled nursing facility once improved   Consultants:  None  Procedures:  None  Antibiotics:  None  HPI/Subjective: Patient seen and examined this morning. Reports left upper quadrant pain  Objective: Filed Vitals:   10/11/13 1019  BP: 159/88  Pulse:   Temp:   Resp:     Intake/Output Summary (Last 24 hours) at 10/11/13 1113 Last data filed at 10/11/13 0814  Gross per 24 hour  Intake    929 ml  Output    425 ml  Net    504 ml   Filed Weights   10/11/13 0035 10/11/13 0204 10/11/13 0500  Weight: 67.4 kg (148 lb 9.4 oz) 67.4 kg (148 lb 9.4 oz) 67.4 kg (148 lb 9.4 oz)    Exam:   General:  Elderly male in no acute distress  HEENT: No pallor, moist oral mucosa  Cardiovascular: S1 and S2 irregular, no murmurs rub or gallop  Respiratory: Clear breath sounds bilaterally  Abdomen: Soft, nondistended, bowel sounds present, tender to palpation over her left upper and mid quadrant  Musculoskeletal: : No edema  CNS: Alert and oriented X2  Data Reviewed: Basic Metabolic Panel:  Recent Labs Lab 10/10/13 2135 10/11/13 0958  NA 141 143  K 3.8 3.5*  CL 101 102  CO2 24 23  GLUCOSE 146* 159*  BUN  53* 45*  CREATININE 2.23* 1.92*  CALCIUM 10.0 9.2  MG  --  2.1  PHOS  --  2.7   Liver Function Tests:  Recent Labs Lab 10/10/13 2135 10/11/13 0958  AST 15 14  ALT 8 7  ALKPHOS 100 83  BILITOT 0.3 0.5  PROT 7.8 7.2  ALBUMIN 3.2* 3.1*   No results found for this basename: LIPASE, AMYLASE,  in the last 168 hours No  results found for this basename: AMMONIA,  in the last 168 hours CBC:  Recent Labs Lab 10/10/13 2135 10/11/13 0958  WBC 20.0* 14.4*  NEUTROABS 17.1*  --   HGB 12.2* 9.8*  HCT 37.2* 29.7*  MCV 90.7 90.5  PLT 332 199   Cardiac Enzymes: No results found for this basename: CKTOTAL, CKMB, CKMBINDEX, TROPONINI,  in the last 168 hours BNP (last 3 results) No results found for this basename: PROBNP,  in the last 8760 hours CBG:  Recent Labs Lab 10/11/13 0151 10/11/13 0612  GLUCAP 153* 127*    Recent Results (from the past 240 hour(s))  MRSA PCR SCREENING     Status: Abnormal   Collection Time    10/11/13  1:59 AM      Result Value Ref Range Status   MRSA by PCR POSITIVE (*) NEGATIVE Final   Comment:            The GeneXpert MRSA Assay (FDA     approved for NASAL specimens     only), is one component of a     comprehensive MRSA colonization     surveillance program. It is not     intended to diagnose MRSA     infection nor to guide or     monitor treatment for     MRSA infections.     RESULT CALLED TO, READ BACK BY AND VERIFIED WITH:     B.DAVIS,RN AT 0413 0N 10/11/13 BY SHEAW     Studies: Ct Abdomen Pelvis Wo Contrast  10/10/2013   CLINICAL DATA:  Larey Seat.  Left upper quadrant pain.  EXAM: CT ABDOMEN AND PELVIS WITHOUT CONTRAST  TECHNIQUE: Multidetector CT imaging of the abdomen and pelvis was performed following the standard protocol without IV contrast.  COMPARISON:  CT scan 02/26/2011  FINDINGS: The lung bases demonstrate dependent atelectasis and scarring. No pleural effusion. A calcified granuloma is noted at the left lung base. The heart is within normal limits in size. Coronary artery calcifications are noted. There is a moderate-sized hiatal hernia.  The large pancreatic cyst demonstrates evidence of interval hemorrhage with high attenuation material within the cyst measuring up to 70 Hounsfield units. This also hematoma/ hemorrhage in the left upper quadrant adjacent to  the stomach and anterior to the spleen. I do not see a definite splenic rupture but recommend close followup.  The liver is unremarkable and stable. The gallbladder appears normal. No common bile duct dilatation. Moderate atrophy of the pancreatic head. The left adrenal gland demonstrates a small nodule which is likely a benign adenoma. The kidneys demonstrate numerous cysts. Right renal calculi are noted.  The stomach, duodenum, small bowel and colon are grossly normal. No inflammatory changes, mass lesions or obstructive findings. No mesenteric or retroperitoneal mass or adenopathy. Stable atherosclerotic calcifications involving the aorta and branch vessels.  CT pelvis: The prostate gland is mildly enlarged with median lobe hypertrophy impressing on the base of the bladder. The bladder is normal. No pelvic mass, adenopathy or hematoma. No inguinal mass or adenopathy.  The bony structures are intact. Advanced osteoporosis and degenerative changes in the spine.  IMPRESSION: 1. Left upper quadrant hematoma likely due to a small bleeding omental or mesenteric vein. This is adjacent to the spleen but I do not see a definite splenic injury. Recommend close followup. There is also evidence of hemorrhage into the pancreatic cyst. 2. Multiple stable renal cysts and right renal calculi. 3. No definite left-sided rib fractures. 4. Stable prostate gland enlargement.   Electronically Signed   By: Loralie Champagne M.D.   On: 10/10/2013 23:14   Ct Head Wo Contrast  10/10/2013   CLINICAL DATA:  Rolled out of bed; concern for head or cervical spine injury.  EXAM: CT HEAD WITHOUT CONTRAST  CT CERVICAL SPINE WITHOUT CONTRAST  TECHNIQUE: Multidetector CT imaging of the head and cervical spine was performed following the standard protocol without intravenous contrast. Multiplanar CT image reconstructions of the cervical spine were also generated.  COMPARISON:  MRI of the brain performed 09/25/2013, CT of the head performed  11/13/2010, and CT of the cervical spine performed 09/19/2009  FINDINGS: CT HEAD FINDINGS  There is no evidence of acute infarction, mass lesion, or intra- or extra-axial hemorrhage on CT.  Prominence of the ventricles and sulci reflects mild to moderate cortical volume loss. Mild cerebellar atrophy is noted. Scattered periventricular and subcortical white matter change likely reflects small vessel ischemic microangiopathy.  The brainstem and fourth ventricle are within normal limits. The basal ganglia are unremarkable in appearance. The cerebral hemispheres demonstrate grossly normal gray-white differentiation. No mass effect or midline shift is seen.  There is no evidence of fracture; visualized osseous structures are unremarkable in appearance. The orbits are within normal limits. The paranasal sinuses and mastoid air cells are well-aerated. No significant soft tissue abnormalities are seen.  CT CERVICAL SPINE FINDINGS  There is no evidence of fracture or subluxation. Mild vacuum phenomenon is noted along the cervical spine. Underlying facet disease is noted, more prominent on the right. Vertebral bodies demonstrate normal height and alignment. Intervertebral disc spaces are otherwise preserved. Prevertebral soft tissues are within normal limits.  The thyroid gland is unremarkable in appearance. The visualized lung apices are clear. Mild calcification is noted at the carotid bifurcations bilaterally.  IMPRESSION: 1. No evidence of traumatic intracranial injury or fracture. 2. No evidence of fracture or subluxation along the cervical spine. 3. Mild to moderate cortical volume loss and scattered small vessel ischemic microangiopathy. 4. Minimal degenerative change noted along the cervical spine. 5. Mild calcification at the carotid bifurcations bilaterally.   Electronically Signed   By: Roanna Raider M.D.   On: 10/10/2013 23:13   Ct Cervical Spine Wo Contrast  10/10/2013   CLINICAL DATA:  Rolled out of bed;  concern for head or cervical spine injury.  EXAM: CT HEAD WITHOUT CONTRAST  CT CERVICAL SPINE WITHOUT CONTRAST  TECHNIQUE: Multidetector CT imaging of the head and cervical spine was performed following the standard protocol without intravenous contrast. Multiplanar CT image reconstructions of the cervical spine were also generated.  COMPARISON:  MRI of the brain performed 09/25/2013, CT of the head performed 11/13/2010, and CT of the cervical spine performed 09/19/2009  FINDINGS: CT HEAD FINDINGS  There is no evidence of acute infarction, mass lesion, or intra- or extra-axial hemorrhage on CT.  Prominence of the ventricles and sulci reflects mild to moderate cortical volume loss. Mild cerebellar atrophy is noted. Scattered periventricular and subcortical white matter change likely reflects small vessel ischemic microangiopathy.  The  brainstem and fourth ventricle are within normal limits. The basal ganglia are unremarkable in appearance. The cerebral hemispheres demonstrate grossly normal gray-white differentiation. No mass effect or midline shift is seen.  There is no evidence of fracture; visualized osseous structures are unremarkable in appearance. The orbits are within normal limits. The paranasal sinuses and mastoid air cells are well-aerated. No significant soft tissue abnormalities are seen.  CT CERVICAL SPINE FINDINGS  There is no evidence of fracture or subluxation. Mild vacuum phenomenon is noted along the cervical spine. Underlying facet disease is noted, more prominent on the right. Vertebral bodies demonstrate normal height and alignment. Intervertebral disc spaces are otherwise preserved. Prevertebral soft tissues are within normal limits.  The thyroid gland is unremarkable in appearance. The visualized lung apices are clear. Mild calcification is noted at the carotid bifurcations bilaterally.  IMPRESSION: 1. No evidence of traumatic intracranial injury or fracture. 2. No evidence of fracture or  subluxation along the cervical spine. 3. Mild to moderate cortical volume loss and scattered small vessel ischemic microangiopathy. 4. Minimal degenerative change noted along the cervical spine. 5. Mild calcification at the carotid bifurcations bilaterally.   Electronically Signed   By: Roanna Raider M.D.   On: 10/10/2013 23:13    Scheduled Meds: . sodium chloride   Intravenous STAT  . sodium chloride   Intravenous Once  . amLODipine  2.5 mg Oral Daily  . docusate sodium  100 mg Oral BID  . ferrous sulfate  325 mg Oral QPC breakfast  . insulin aspart  0-9 Units Subcutaneous Q6H  . labetalol  100 mg Oral BID  . pantoprazole  40 mg Oral BID AC  . PARoxetine  10 mg Oral Daily  . tamsulosin  0.4 mg Oral Daily   Continuous Infusions: . sodium chloride 50 mL/hr at 10/11/13 6045       Time spent: 25 minutes    Eddie North  [facility] Pager [phone] If 7PM-7AM, please contact night-coverage at [email or web address], password [facility] 10/11/2013, 11:13 AM  LOS: 1 day

## 2013-10-11 NOTE — H&P (Signed)
[facility] History and Physical  Patient: Clifford Spencer  ZOX:[id number]  DOB: Jun 18, 1925  DOS: the patient was seen and examined on 10/11/2013 PCP: No primary provider on file.  Chief Complaint: Fall  HPI: Clifford Spencer is a 80 y.o. male with Past medical history of coronary artery disease, A. fib, BPH, dementia, GERD, hypertension. The patient presented with a fall. Patient mentions that he was trying to go to the bathroom and was trying to go up of his bed. He does over and fell most of that. There was reported injury to his head with bed railing. Patient denies any focal deficit headache. Patient denies any chest pain dizziness lightheadedness. Patient complains of left-sided abdominal pain which has been present since last couple of days since his prior fall He mentions he had a fall 3 weeks ago at which time he had an ankle fracture mid and upper currently he denies any ankle pain at all. His lab work was checked on September 1 and his creatinine was 1.1 INR was 4. His warfarin was held for 4 days and it was resumed at a lower dose few days ago. There is no other recent change in his medication based on documentation.  The patient is coming from skilled nursing facility. And at his baseline independent for most of his ADL.  Review of Systems: as mentioned in the history of present illness.  A Comprehensive review of the other systems is negative.  Past Medical History  Diagnosis Date  . Coronary artery disease   . UTI (lower urinary tract infection)   . GERD (gastroesophageal reflux disease)   . Arthritis   . Dementia   . Hypertension   . BPH (benign prostatic hyperplasia)   . Anxiety   . Sciatica   . Hyperlipidemia   . Renal calculi   . Urinary frequency   . Urge incontinence   . DJD (degenerative joint disease), lumbar   . Microscopic hematuria   . CAD (coronary artery disease)   . Urethral bleeding   . A-fib    Past Surgical History  Procedure Laterality  Date  . Back surgery    . Cardiac surgery    . Coronary artery bypass graft    . Prostate biopsy     Social History:  reports that he has never smoked. He does not have any smokeless tobacco history on file. He reports that he does not drink alcohol or use illicit drugs.  No Known Allergies  Family History  Problem Relation Age of Onset  . Breast cancer Mother     Prior to Admission medications   Medication Sig Start Date End Date Taking? Authorizing Provider  acetaminophen (TYLENOL) 325 MG tablet Take 650 mg by mouth every 4 (four) hours as needed for mild pain.   Yes Historical Provider, MD  amLODipine (NORVASC) 2.5 MG tablet Take 2.5 mg by mouth daily.   Yes Historical Provider, MD  aspirin 325 MG tablet Take 650 mg by mouth daily.   Yes Historical Provider, MD  calcium carbonate (TUMS - DOSED IN MG ELEMENTAL CALCIUM) 500 MG chewable tablet Chew 1 tablet by mouth every 4 (four) hours as needed for indigestion or heartburn.   Yes Historical Provider, MD  Cholecalciferol (VITAMIN D3) 5000 UNITS TABS Take 1 tablet by mouth daily.   Yes Historical Provider, MD  Coenzyme Q10 (COQ10) 200 MG CAPS Take 1 capsule by mouth at bedtime.   Yes Historical Provider, MD  docusate sodium (COLACE) 100 MG  capsule Take 100 mg by mouth 2 (two) times daily.   Yes Historical Provider, MD  ferrous sulfate 325 (65 FE) MG tablet Take 325 mg by mouth daily with breakfast.   Yes Historical Provider, MD  gemfibrozil (LOPID) 600 MG tablet Take 600 mg by mouth 2 (two) times daily before a meal.   Yes Historical Provider, MD  labetalol (NORMODYNE) 100 MG tablet Take 100 mg by mouth 2 (two) times daily.   Yes Historical Provider, MD  lactobacillus acidophilus (BACID) TABS tablet Take 2 tablets by mouth 2 (two) times daily.   Yes Historical Provider, MD  magnesium oxide (MAG-OX) 400 MG tablet Take 400 mg by mouth 2 (two) times daily.   Yes Historical Provider, MD  metFORMIN (GLUCOPHAGE) 850 MG tablet Take 850 mg by  mouth 2 (two) times daily with a meal.   Yes Historical Provider, MD  nitroGLYCERIN (NITROSTAT) 0.4 MG SL tablet Place 0.4 mg under the tongue every 5 (five) minutes as needed for chest pain.   Yes Historical Provider, MD  pantoprazole (PROTONIX) 20 MG tablet Take 10 mg by mouth daily.   Yes Historical Provider, MD  PARoxetine (PAXIL) 10 MG tablet Take 10 mg by mouth daily.   Yes Historical Provider, MD  promethazine (PHENERGAN) 25 MG/ML injection Inject 25 mg into the muscle every 6 (six) hours as needed for nausea or vomiting.   Yes Historical Provider, MD  tamsulosin (FLOMAX) 0.4 MG CAPS Take 0.4 mg by mouth daily after breakfast.   Yes Historical Provider, MD  traMADol (ULTRAM) 50 MG tablet Take 50 mg by mouth 2 (two) times daily.   Yes Historical Provider, MD  traMADol (ULTRAM) 50 MG tablet Take 50 mg by mouth every 6 (six) hours as needed for moderate pain.   Yes Historical Provider, MD  warfarin (COUMADIN) 3 MG tablet Take 3 mg by mouth every evening.   Yes Historical Provider, MD    Physical Exam: Filed Vitals:   10/10/13 2313 10/11/13 0035 10/11/13 0100 10/11/13 0204  BP: 145/71 152/61 130/97   Pulse: 86 93 97   Temp:      TempSrc:      Resp: Weight:    67.4 kg (148 lb 9.4 oz)  SpO2: 93% 95% 93%     General: Alert, Awake and Oriented to Time, Place and Person. Appear in mild distress Eyes: PERRL ENT: Oral Mucosa clear dry. Neck: No JVD Cardiovascular: S1 and S2 Present, aortic systolic Murmur, Peripheral Pulses Present Respiratory: Bilateral Air entry equal and Decreased, Clear to Auscultation, no Crackles, no wheezes Abdomen: Bowel Sound Present, Soft and mild left upper tender Skin: No Rash Extremities: No Pedal edema, no calf tenderness Neurologic: Grossly no focal neuro deficit.  Labs on Admission:  CBC:  Recent Labs Lab 10/10/13 2135  WBC 20.0*  NEUTROABS 17.1*  HGB 12.2*  HCT 37.2*  MCV 90.7  PLT 332    CMP     Component Value Date/Time    NA 141 10/10/2013 2135   K 3.8 10/10/2013 2135   CL 101 10/10/2013 2135   CO2 24 10/10/2013 2135   GLUCOSE 146* 10/10/2013 2135   BUN 53* 10/10/2013 2135   CREATININE 2.23* 10/10/2013 2135   CALCIUM 10.0 10/10/2013 2135   PROT 7.8 10/10/2013 2135   ALBUMIN 3.2* 10/10/2013 2135   AST 15 10/10/2013 2135   ALT 8 10/10/2013 2135   ALKPHOS 100 10/10/2013 2135   BILITOT 0.3 10/10/2013 2135   GFRNONAA  25* 10/10/2013 2135   GFRAA 29* 10/10/2013 2135    No results found for this basename: LIPASE, AMYLASE,  in the last 168 hours No results found for this basename: AMMONIA,  in the last 168 hours  No results found for this basename: CKTOTAL, CKMB, CKMBINDEX, TROPONINI,  in the last 168 hours BNP (last 3 results) No results found for this basename: PROBNP,  in the last 8760 hours  Radiological Exams on Admission: Ct Abdomen Pelvis Wo Contrast  10/10/2013   CLINICAL DATA:  Larey Seat.  Left upper quadrant pain.  EXAM: CT ABDOMEN AND PELVIS WITHOUT CONTRAST  TECHNIQUE: Multidetector CT imaging of the abdomen and pelvis was performed following the standard protocol without IV contrast.  COMPARISON:  CT scan 02/26/2011  FINDINGS: The lung bases demonstrate dependent atelectasis and scarring. No pleural effusion. A calcified granuloma is noted at the left lung base. The heart is within normal limits in size. Coronary artery calcifications are noted. There is a moderate-sized hiatal hernia.  The large pancreatic cyst demonstrates evidence of interval hemorrhage with high attenuation material within the cyst measuring up to 70 Hounsfield units. This also hematoma/ hemorrhage in the left upper quadrant adjacent to the stomach and anterior to the spleen. I do not see a definite splenic rupture but recommend close followup.  The liver is unremarkable and stable. The gallbladder appears normal. No common bile duct dilatation. Moderate atrophy of the pancreatic head. The left adrenal gland demonstrates a small nodule which is likely a  benign adenoma. The kidneys demonstrate numerous cysts. Right renal calculi are noted.  The stomach, duodenum, small bowel and colon are grossly normal. No inflammatory changes, mass lesions or obstructive findings. No mesenteric or retroperitoneal mass or adenopathy. Stable atherosclerotic calcifications involving the aorta and branch vessels.  CT pelvis: The prostate gland is mildly enlarged with median lobe hypertrophy impressing on the base of the bladder. The bladder is normal. No pelvic mass, adenopathy or hematoma. No inguinal mass or adenopathy.  The bony structures are intact. Advanced osteoporosis and degenerative changes in the spine.  IMPRESSION: 1. Left upper quadrant hematoma likely due to a small bleeding omental or mesenteric vein. This is adjacent to the spleen but I do not see a definite splenic injury. Recommend close followup. There is also evidence of hemorrhage into the pancreatic cyst. 2. Multiple stable renal cysts and right renal calculi. 3. No definite left-sided rib fractures. 4. Stable prostate gland enlargement.   Electronically Signed   By: Loralie Champagne M.D.   On: 10/10/2013 23:14   Ct Head Wo Contrast  10/10/2013   CLINICAL DATA:  Rolled out of bed; concern for head or cervical spine injury.  EXAM: CT HEAD WITHOUT CONTRAST  CT CERVICAL SPINE WITHOUT CONTRAST  TECHNIQUE: Multidetector CT imaging of the head and cervical spine was performed following the standard protocol without intravenous contrast. Multiplanar CT image reconstructions of the cervical spine were also generated.  COMPARISON:  MRI of the brain performed 09/25/2013, CT of the head performed 11/13/2010, and CT of the cervical spine performed 09/19/2009  FINDINGS: CT HEAD FINDINGS  There is no evidence of acute infarction, mass lesion, or intra- or extra-axial hemorrhage on CT.  Prominence of the ventricles and sulci reflects mild to moderate cortical volume loss. Mild cerebellar atrophy is noted. Scattered  periventricular and subcortical white matter change likely reflects small vessel ischemic microangiopathy.  The brainstem and fourth ventricle are within normal limits. The basal ganglia are unremarkable in appearance. The cerebral  hemispheres demonstrate grossly normal gray-white differentiation. No mass effect or midline shift is seen.  There is no evidence of fracture; visualized osseous structures are unremarkable in appearance. The orbits are within normal limits. The paranasal sinuses and mastoid air cells are well-aerated. No significant soft tissue abnormalities are seen.  CT CERVICAL SPINE FINDINGS  There is no evidence of fracture or subluxation. Mild vacuum phenomenon is noted along the cervical spine. Underlying facet disease is noted, more prominent on the right. Vertebral bodies demonstrate normal height and alignment. Intervertebral disc spaces are otherwise preserved. Prevertebral soft tissues are within normal limits.  The thyroid gland is unremarkable in appearance. The visualized lung apices are clear. Mild calcification is noted at the carotid bifurcations bilaterally.  IMPRESSION: 1. No evidence of traumatic intracranial injury or fracture. 2. No evidence of fracture or subluxation along the cervical spine. 3. Mild to moderate cortical volume loss and scattered small vessel ischemic microangiopathy. 4. Minimal degenerative change noted along the cervical spine. 5. Mild calcification at the carotid bifurcations bilaterally.   Electronically Signed   By: Roanna Raider M.D.   On: 10/10/2013 23:13   Ct Cervical Spine Wo Contrast  10/10/2013   CLINICAL DATA:  Rolled out of bed; concern for head or cervical spine injury.  EXAM: CT HEAD WITHOUT CONTRAST  CT CERVICAL SPINE WITHOUT CONTRAST  TECHNIQUE: Multidetector CT imaging of the head and cervical spine was performed following the standard protocol without intravenous contrast. Multiplanar CT image reconstructions of the cervical spine were also  generated.  COMPARISON:  MRI of the brain performed 09/25/2013, CT of the head performed 11/13/2010, and CT of the cervical spine performed 09/19/2009  FINDINGS: CT HEAD FINDINGS  There is no evidence of acute infarction, mass lesion, or intra- or extra-axial hemorrhage on CT.  Prominence of the ventricles and sulci reflects mild to moderate cortical volume loss. Mild cerebellar atrophy is noted. Scattered periventricular and subcortical white matter change likely reflects small vessel ischemic microangiopathy.  The brainstem and fourth ventricle are within normal limits. The basal ganglia are unremarkable in appearance. The cerebral hemispheres demonstrate grossly normal gray-white differentiation. No mass effect or midline shift is seen.  There is no evidence of fracture; visualized osseous structures are unremarkable in appearance. The orbits are within normal limits. The paranasal sinuses and mastoid air cells are well-aerated. No significant soft tissue abnormalities are seen.  CT CERVICAL SPINE FINDINGS  There is no evidence of fracture or subluxation. Mild vacuum phenomenon is noted along the cervical spine. Underlying facet disease is noted, more prominent on the right. Vertebral bodies demonstrate normal height and alignment. Intervertebral disc spaces are otherwise preserved. Prevertebral soft tissues are within normal limits.  The thyroid gland is unremarkable in appearance. The visualized lung apices are clear. Mild calcification is noted at the carotid bifurcations bilaterally.  IMPRESSION: 1. No evidence of traumatic intracranial injury or fracture. 2. No evidence of fracture or subluxation along the cervical spine. 3. Mild to moderate cortical volume loss and scattered small vessel ischemic microangiopathy. 4. Minimal degenerative change noted along the cervical spine. 5. Mild calcification at the carotid bifurcations bilaterally.   Electronically Signed   By: Roanna Raider M.D.   On: 10/10/2013  23:13    EKG: Independently reviewed. Atrial flutter. Assessment/Plan Principal Problem:   Intra-abdominal hematoma Active Problems:   CAD (coronary artery disease)   BPH (benign prostatic hyperplasia)   Dehydration   A-fib   Hypertension   Dementia   GERD (gastroesophageal reflux  disease)   Warfarin-induced coagulopathy   1. Intra-abdominal hematoma Fall The patient is presenting with complaints of a fall. He had sustained a fall couple of days ago as well and has some abdominal pain.  on further workup is found to have left upper quadrant hematoma likely venous bleeding without any splenic injury, pancreatic pseudocyst with hemorrhage. His CT of the head and C-spine were negative for any acute abnormality. Patient does not appear to have any focal deficit. Patient's INR is elevated supratherapeutic. Patient also appears to have mild acute kidney injury without any obstructive uropathy. With this the patient is currently admitted in the step down unit. We will give him IV hydration. General surgery was initially consulted recommends observation for the patient at present. We will continue monitoring his hemoglobin every 4 hours. Patient will be given FFP 2 units as well as 10 mg of IV vitamin K. recheck INR.  2. Acute kidney injury Dehydration At present no etiology identified for patient's dehydration. I would continue him on IV hydration monitor BMP. Avoid nephrotoxic medication.  3. Hypertension. Patient's blood pressure stable. At present I would continue his home antihypertensive medication from tomorrow.  4. GERD. Continue Protonix.  Consults: General surgery  DVT Prophylaxis: mechanical compression device Nutrition: N.p.o. except medication  Code Status: DNR/DNI and  Disposition: Admitted to inpatient in step-down unit.  Author: Lynden Oxford, MD [facility] Pager: [phone] 10/11/2013, 2:24 AM    If 7PM-7AM, please contact  night-coverage [email or web address] Password [email or web address]  **Disclaimer: This note may have been dictated with voice recognition software. Similar sounding words can inadvertently be transcribed and this note may contain transcription errors which may not have been corrected upon publication of note.**

## 2013-10-11 NOTE — ED Notes (Signed)
Critical Lab INR 9.45 Called by Tinnie Gens (lab) Read back and Verified [place], [place] notified

## 2013-10-12 ENCOUNTER — Inpatient Hospital Stay (HOSPITAL_COMMUNITY): Payer: Medicare Other

## 2013-10-12 DIAGNOSIS — D62 Acute posthemorrhagic anemia: Secondary | ICD-10-CM

## 2013-10-12 DIAGNOSIS — S3690XS Unspecified injury of unspecified intra-abdominal organ, sequela: Secondary | ICD-10-CM

## 2013-10-12 DIAGNOSIS — E876 Hypokalemia: Secondary | ICD-10-CM

## 2013-10-12 LAB — CBC
HEMATOCRIT: 26.7 % — AB (ref 39.0–52.0)
Hemoglobin: 8.7 g/dL — ABNORMAL LOW (ref 13.0–17.0)
MCH: 29.6 pg (ref 26.0–34.0)
MCHC: 32.6 g/dL (ref 30.0–36.0)
MCV: 90.8 fL (ref 78.0–100.0)
Platelets: 204 10*3/uL (ref 150–400)
RBC: 2.94 MIL/uL — ABNORMAL LOW (ref 4.22–5.81)
RDW: 16.1 % — AB (ref 11.5–15.5)
WBC: 12.4 10*3/uL — AB (ref 4.0–10.5)

## 2013-10-12 LAB — BASIC METABOLIC PANEL
Anion gap: 14 (ref 5–15)
BUN: 36 mg/dL — AB (ref 6–23)
CHLORIDE: 109 meq/L (ref 96–112)
CO2: 21 mEq/L (ref 19–32)
CREATININE: 1.81 mg/dL — AB (ref 0.50–1.35)
Calcium: 8.9 mg/dL (ref 8.4–10.5)
GFR calc Af Amer: 37 mL/min — ABNORMAL LOW (ref >90)
GFR, EST NON AFRICAN AMERICAN: 32 mL/min — AB (ref >90)
Glucose, Bld: 112 mg/dL — ABNORMAL HIGH (ref 70–99)
POTASSIUM: 3.5 meq/L — AB (ref 3.7–5.3)
Sodium: 144 mEq/L (ref 137–147)

## 2013-10-12 LAB — GLUCOSE, CAPILLARY
GLUCOSE-CAPILLARY: 155 mg/dL — AB (ref 70–99)
GLUCOSE-CAPILLARY: 163 mg/dL — AB (ref 70–99)
Glucose-Capillary: 118 mg/dL — ABNORMAL HIGH (ref 70–99)
Glucose-Capillary: 122 mg/dL — ABNORMAL HIGH (ref 70–99)
Glucose-Capillary: 93 mg/dL (ref 70–99)

## 2013-10-12 LAB — PROTIME-INR
INR: 1.4 (ref 0.00–1.49)
Prothrombin Time: 17.2 seconds — ABNORMAL HIGH (ref 11.6–15.2)

## 2013-10-12 MED ORDER — INSULIN ASPART 100 UNIT/ML ~~LOC~~ SOLN
0.0000 [IU] | Freq: Three times a day (TID) | SUBCUTANEOUS | Status: DC
  Administered 2013-10-13: 1 [IU] via SUBCUTANEOUS
  Administered 2013-10-13: 2 [IU] via SUBCUTANEOUS
  Administered 2013-10-13 – 2013-10-14 (×4): 1 [IU] via SUBCUTANEOUS
  Administered 2013-10-15: 2 [IU] via SUBCUTANEOUS

## 2013-10-12 MED ORDER — MUPIROCIN 2 % EX OINT
1.0000 | TOPICAL_OINTMENT | Freq: Two times a day (BID) | CUTANEOUS | Status: DC
Start: 2013-10-12 — End: 2013-10-15
  Administered 2013-10-12 – 2013-10-15 (×7): 1 via NASAL
  Filled 2013-10-12 (×2): qty 22

## 2013-10-12 MED ORDER — CALCIUM CARBONATE ANTACID 500 MG PO CHEW: 1.0000 | CHEWABLE_TABLET | ORAL | Status: DC | PRN

## 2013-10-12 MED ORDER — GEMFIBROZIL 600 MG PO TABS
600.0000 mg | ORAL_TABLET | Freq: Two times a day (BID) | ORAL | Status: DC
  Administered 2013-10-12 – 2013-10-15 (×7): 600 mg via ORAL
  Filled 2013-10-12 (×11): qty 1

## 2013-10-12 MED ORDER — OXYCODONE-ACETAMINOPHEN 5-325 MG PO TABS
1.0000 | ORAL_TABLET | ORAL | Status: DC | PRN
  Administered 2013-10-12 – 2013-10-13 (×3): 1 via ORAL
  Filled 2013-10-12 (×3): qty 1

## 2013-10-12 MED ORDER — CHLORHEXIDINE GLUCONATE CLOTH 2 % EX PADS
6.0000 | MEDICATED_PAD | Freq: Every day | CUTANEOUS | Status: DC
  Administered 2013-10-12 – 2013-10-15 (×3): 6 via TOPICAL

## 2013-10-12 MED ORDER — POTASSIUM CHLORIDE CRYS ER 20 MEQ PO TBCR
40.0000 meq | EXTENDED_RELEASE_TABLET | Freq: Once | ORAL | Status: AC
  Administered 2013-10-12: 40 meq via ORAL
  Filled 2013-10-12: qty 2

## 2013-10-12 NOTE — Progress Notes (Signed)
[facility] PROGRESS NOTE  Clifford Spencer ZOX:[id number] DOB: 1926-01-26 DOA: 10/10/2013 PCP: No primary provider on file.   Brief narrative 80 year old male with history of coronary artery disease, A. fib on Coumadin, BPH, dementia, GERD, hypertension placement of skilled nursing facility was brought to the ED after he had a fall while going to the bathroom. He also reported having left-sided abdominal pain since past 2 days and also had an episode of vomiting on the day of admission. Patient was found to have left upper quadrant intra-abdominal hematoma on CT scan with supratherapeutic INR of 9.4. General surgery was consulted who recommended bedrest and close monitoring and patient was admitted to step down under hospitalist service.    Assessment/Plan: intra-abdominal hematoma Reportedly patient sustained a fall 2 days back and was having some abdominal pain since then. CT scan with left upper quadrant hematoma likely venous bleeding without any splenic injury the. Also shows hemorrhage into pancreatic cyst. CT scan of the head and C-spine were negative for any acute injury. -Patient given FFP and vitamin K and his INR has reversed.. Given recurrent falls will d/c coumadin completely. -Continue with bedrest with serial abdominal exam. Pain control as needed. Add percocet. -drop in h&H noted.   baseline hemoglobin is around 10-11. Monitor closely. Does not need transfusion at this time.  -Hold aspirin -Pain control with Toradol and low dose morphine.  Acute kidney injury Possibly secondary to dehydration. Patient reports  Drinking less fluid  past several days. Continue with IV hydration. Renal fn slowly improving.  Hypokalemia  replenish   Leukocytosis Possibly reactive/stress  induced. No signs of infection. improving  Iron deficiency anemia Baseline hemoglobin of 10-11. Continue iron supplements  Coronary artery disease  Hold aspirin. Continue lopid   Uncontrolled  hypertension Low BP overnight. Hold alodipine  Diabetes mellitus  on sliding scale insulin.  GERD Continue PPI  BPH Continue Flomax.   Recent left ankle fracture Non tender on exam and has good ROM. Will follow with an x ray.   Code Status: DO NOT RESUSCITATE Family Communication: None at bedside. Has a step son who is POA. Will inform Disposition Plan: transfer to tele.Return to skilled nursing facility once improved   Consultants:  None  Procedures:  None  Antibiotics:  None  HPI/Subjective: Patient seen and examined this morning. Still has  left upper quadrant pain  Objective: Filed Vitals:   10/12/13 0400  BP: 97/31  Pulse: 83  Temp:   Resp: 22    Intake/Output Summary (Last 24 hours) at 10/12/13 0857 Last data filed at 10/12/13 0500  Gross per 24 hour  Intake   1110 ml  Output    680 ml  Net    430 ml   Filed Weights   10/11/13 0204 10/11/13 0500 10/12/13 0035  Weight: 67.4 kg (148 lb 9.4 oz) 67.4 kg (148 lb 9.4 oz) 68.1 kg (150 lb 2.1 oz)    Exam:   General:  Elderly pleasant male in no acute distress  HEENT:  moist oral mucosa  Cardiovascular: S1 and S2 irregular, no murmurs rub or gallop  Respiratory: Clear breath sounds bilaterally  Abdomen: Soft, nondistended, bowel sounds present, tender to palpation over her left upper quadrant  Musculoskeletal: : No edema, Multiple areas of bluise over b/l arms and rt leg  CNS: Alert and oriented  Data Reviewed: Basic Metabolic Panel:  Recent Labs Lab 10/10/13 2135 10/11/13 0958 10/12/13 0341  NA 141 143 144  K 3.8 3.5* 3.5*  CL 101 102 109  CO2 GLUCOSE 146* 159* 112*  BUN 53* 45* 36*  CREATININE 2.23* 1.92* 1.81*  CALCIUM 10.0 9.2 8.9  MG  --  2.1  --   PHOS  --  2.7  --    Liver Function Tests:  Recent Labs Lab 10/10/13 2135 10/11/13 0958  AST 15 14  ALT 8 7  ALKPHOS 100 83  BILITOT 0.3 0.5  PROT 7.8 7.2  ALBUMIN 3.2* 3.1*   No results found for this  basename: LIPASE, AMYLASE,  in the last 168 hours No results found for this basename: AMMONIA,  in the last 168 hours CBC:  Recent Labs Lab 10/10/13 2135 10/11/13 0958 10/12/13 0341  WBC 20.0* 14.4* 12.4*  NEUTROABS 17.1*  --   --   HGB 12.2* 9.8* 8.7*  HCT 37.2* 29.7* 26.7*  MCV 90.7 90.5 90.8  PLT 332 199 204   Cardiac Enzymes: No results found for this basename: CKTOTAL, CKMB, CKMBINDEX, TROPONINI,  in the last 168 hours BNP (last 3 results) No results found for this basename: PROBNP,  in the last 8760 hours CBG:  Recent Labs Lab 10/11/13 0612 10/11/13 1139 10/11/13 1815 10/12/13 0006 10/12/13 0631  GLUCAP 127* 178* 136* 122* 118*    Recent Results (from the past 240 hour(s))  MRSA PCR SCREENING     Status: Abnormal   Collection Time    10/11/13  1:59 AM      Result Value Ref Range Status   MRSA by PCR POSITIVE (*) NEGATIVE Final   Comment:            The GeneXpert MRSA Assay (FDA     approved for NASAL specimens     only), is one component of a     comprehensive MRSA colonization     surveillance program. It is not     intended to diagnose MRSA     infection nor to guide or     monitor treatment for     MRSA infections.     RESULT CALLED TO, READ BACK BY AND VERIFIED WITH:     B.DAVIS,RN AT 0413 0N 10/11/13 BY SHEAW     Studies: Ct Abdomen Pelvis Wo Contrast  10/10/2013   CLINICAL DATA:  Larey Seat.  Left upper quadrant pain.  EXAM: CT ABDOMEN AND PELVIS WITHOUT CONTRAST  TECHNIQUE: Multidetector CT imaging of the abdomen and pelvis was performed following the standard protocol without IV contrast.  COMPARISON:  CT scan 02/26/2011  FINDINGS: The lung bases demonstrate dependent atelectasis and scarring. No pleural effusion. A calcified granuloma is noted at the left lung base. The heart is within normal limits in size. Coronary artery calcifications are noted. There is a moderate-sized hiatal hernia.  The large pancreatic cyst demonstrates evidence of interval  hemorrhage with high attenuation material within the cyst measuring up to 70 Hounsfield units. This also hematoma/ hemorrhage in the left upper quadrant adjacent to the stomach and anterior to the spleen. I do not see a definite splenic rupture but recommend close followup.  The liver is unremarkable and stable. The gallbladder appears normal. No common bile duct dilatation. Moderate atrophy of the pancreatic head. The left adrenal gland demonstrates a small nodule which is likely a benign adenoma. The kidneys demonstrate numerous cysts. Right renal calculi are noted.  The stomach, duodenum, small bowel and colon are grossly normal. No inflammatory changes, mass lesions or obstructive findings. No mesenteric or retroperitoneal mass or adenopathy. Stable  atherosclerotic calcifications involving the aorta and branch vessels.  CT pelvis: The prostate gland is mildly enlarged with median lobe hypertrophy impressing on the base of the bladder. The bladder is normal. No pelvic mass, adenopathy or hematoma. No inguinal mass or adenopathy.  The bony structures are intact. Advanced osteoporosis and degenerative changes in the spine.  IMPRESSION: 1. Left upper quadrant hematoma likely due to a small bleeding omental or mesenteric vein. This is adjacent to the spleen but I do not see a definite splenic injury. Recommend close followup. There is also evidence of hemorrhage into the pancreatic cyst. 2. Multiple stable renal cysts and right renal calculi. 3. No definite left-sided rib fractures. 4. Stable prostate gland enlargement.   Electronically Signed   By: Loralie Champagne M.D.   On: 10/10/2013 23:14   Ct Head Wo Contrast  10/10/2013   CLINICAL DATA:  Rolled out of bed; concern for head or cervical spine injury.  EXAM: CT HEAD WITHOUT CONTRAST  CT CERVICAL SPINE WITHOUT CONTRAST  TECHNIQUE: Multidetector CT imaging of the head and cervical spine was performed following the standard protocol without intravenous contrast.  Multiplanar CT image reconstructions of the cervical spine were also generated.  COMPARISON:  MRI of the brain performed 09/25/2013, CT of the head performed 11/13/2010, and CT of the cervical spine performed 09/19/2009  FINDINGS: CT HEAD FINDINGS  There is no evidence of acute infarction, mass lesion, or intra- or extra-axial hemorrhage on CT.  Prominence of the ventricles and sulci reflects mild to moderate cortical volume loss. Mild cerebellar atrophy is noted. Scattered periventricular and subcortical white matter change likely reflects small vessel ischemic microangiopathy.  The brainstem and fourth ventricle are within normal limits. The basal ganglia are unremarkable in appearance. The cerebral hemispheres demonstrate grossly normal gray-white differentiation. No mass effect or midline shift is seen.  There is no evidence of fracture; visualized osseous structures are unremarkable in appearance. The orbits are within normal limits. The paranasal sinuses and mastoid air cells are well-aerated. No significant soft tissue abnormalities are seen.  CT CERVICAL SPINE FINDINGS  There is no evidence of fracture or subluxation. Mild vacuum phenomenon is noted along the cervical spine. Underlying facet disease is noted, more prominent on the right. Vertebral bodies demonstrate normal height and alignment. Intervertebral disc spaces are otherwise preserved. Prevertebral soft tissues are within normal limits.  The thyroid gland is unremarkable in appearance. The visualized lung apices are clear. Mild calcification is noted at the carotid bifurcations bilaterally.  IMPRESSION: 1. No evidence of traumatic intracranial injury or fracture. 2. No evidence of fracture or subluxation along the cervical spine. 3. Mild to moderate cortical volume loss and scattered small vessel ischemic microangiopathy. 4. Minimal degenerative change noted along the cervical spine. 5. Mild calcification at the carotid bifurcations bilaterally.    Electronically Signed   By: Roanna Raider M.D.   On: 10/10/2013 23:13   Ct Cervical Spine Wo Contrast  10/10/2013   CLINICAL DATA:  Rolled out of bed; concern for head or cervical spine injury.  EXAM: CT HEAD WITHOUT CONTRAST  CT CERVICAL SPINE WITHOUT CONTRAST  TECHNIQUE: Multidetector CT imaging of the head and cervical spine was performed following the standard protocol without intravenous contrast. Multiplanar CT image reconstructions of the cervical spine were also generated.  COMPARISON:  MRI of the brain performed 09/25/2013, CT of the head performed 11/13/2010, and CT of the cervical spine performed 09/19/2009  FINDINGS: CT HEAD FINDINGS  There is no evidence of acute  infarction, mass lesion, or intra- or extra-axial hemorrhage on CT.  Prominence of the ventricles and sulci reflects mild to moderate cortical volume loss. Mild cerebellar atrophy is noted. Scattered periventricular and subcortical white matter change likely reflects small vessel ischemic microangiopathy.  The brainstem and fourth ventricle are within normal limits. The basal ganglia are unremarkable in appearance. The cerebral hemispheres demonstrate grossly normal gray-white differentiation. No mass effect or midline shift is seen.  There is no evidence of fracture; visualized osseous structures are unremarkable in appearance. The orbits are within normal limits. The paranasal sinuses and mastoid air cells are well-aerated. No significant soft tissue abnormalities are seen.  CT CERVICAL SPINE FINDINGS  There is no evidence of fracture or subluxation. Mild vacuum phenomenon is noted along the cervical spine. Underlying facet disease is noted, more prominent on the right. Vertebral bodies demonstrate normal height and alignment. Intervertebral disc spaces are otherwise preserved. Prevertebral soft tissues are within normal limits.  The thyroid gland is unremarkable in appearance. The visualized lung apices are clear. Mild calcification is  noted at the carotid bifurcations bilaterally.  IMPRESSION: 1. No evidence of traumatic intracranial injury or fracture. 2. No evidence of fracture or subluxation along the cervical spine. 3. Mild to moderate cortical volume loss and scattered small vessel ischemic microangiopathy. 4. Minimal degenerative change noted along the cervical spine. 5. Mild calcification at the carotid bifurcations bilaterally.   Electronically Signed   By: Roanna Raider M.D.   On: 10/10/2013 23:13    Scheduled Meds: . sodium chloride   Intravenous Once  . Chlorhexidine Gluconate Cloth  6 each Topical Q0600  . docusate sodium  100 mg Oral BID  . ferrous sulfate  325 mg Oral QPC breakfast  . gemfibrozil  600 mg Oral BID AC  . insulin aspart  0-9 Units Subcutaneous Q6H  . labetalol  100 mg Oral BID  . mupirocin ointment  1 application Nasal BID  . pantoprazole  40 mg Oral BID AC  . PARoxetine  10 mg Oral Daily  . tamsulosin  0.4 mg Oral Daily   Continuous Infusions: . sodium chloride 50 mL/hr at 10/11/13 4098       Time spent: 25 minutes    Eddie North  [facility] Pager [phone] If 7PM-7AM, please contact night-coverage at [email or web address], password [facility] 10/12/2013, 8:57 AM  LOS: 2 days

## 2013-10-13 LAB — BASIC METABOLIC PANEL
Anion gap: 12 (ref 5–15)
BUN: 25 mg/dL — ABNORMAL HIGH (ref 6–23)
CALCIUM: 8.9 mg/dL (ref 8.4–10.5)
CO2: 23 mEq/L (ref 19–32)
Chloride: 106 mEq/L (ref 96–112)
Creatinine, Ser: 1.62 mg/dL — ABNORMAL HIGH (ref 0.50–1.35)
GFR calc Af Amer: 43 mL/min — ABNORMAL LOW (ref >90)
GFR calc non Af Amer: 37 mL/min — ABNORMAL LOW (ref >90)
GLUCOSE: 185 mg/dL — AB (ref 70–99)
POTASSIUM: 3.9 meq/L (ref 3.7–5.3)
SODIUM: 141 meq/L (ref 137–147)

## 2013-10-13 LAB — GLUCOSE, CAPILLARY
GLUCOSE-CAPILLARY: 132 mg/dL — AB (ref 70–99)
GLUCOSE-CAPILLARY: 164 mg/dL — AB (ref 70–99)
Glucose-Capillary: 118 mg/dL — ABNORMAL HIGH (ref 70–99)
Glucose-Capillary: 123 mg/dL — ABNORMAL HIGH (ref 70–99)

## 2013-10-13 LAB — CBC
HCT: 27.7 % — ABNORMAL LOW (ref 39.0–52.0)
HEMOGLOBIN: 9.1 g/dL — AB (ref 13.0–17.0)
MCH: 30.4 pg (ref 26.0–34.0)
MCHC: 32.9 g/dL (ref 30.0–36.0)
MCV: 92.6 fL (ref 78.0–100.0)
Platelets: 238 10*3/uL (ref 150–400)
RBC: 2.99 MIL/uL — AB (ref 4.22–5.81)
RDW: 16 % — ABNORMAL HIGH (ref 11.5–15.5)
WBC: 13.7 10*3/uL — ABNORMAL HIGH (ref 4.0–10.5)

## 2013-10-13 LAB — PREPARE FRESH FROZEN PLASMA
Unit division: 0
Unit division: 0

## 2013-10-13 NOTE — Progress Notes (Signed)
[facility] PROGRESS NOTE  TRACKER MANCE UJW:[id number] DOB: [birth year] DOA: 10/10/2013 PCP: No primary provider on file.   Brief narrative 80 year old male with history of coronary artery disease, A. fib on Coumadin, BPH, dementia, GERD, hypertension resident of skilled nursing facility was brought to the ED after he had a fall while going to the bathroom. He also reported having left-sided abdominal pain since past 2 days and also had an episode of vomiting on the day of admission. Patient was found to have left upper quadrant intra-abdominal hematoma on CT scan with supratherapeutic INR of 9.4. General surgery was consulted who recommended bedrest and close monitoring and patient was admitted to step down under hospitalist service.    Assessment/Plan: intra-abdominal hematoma Reportedly patient sustained a fall 2 days back and was having some abdominal pain since then. CT scan with left upper quadrant hematoma likely venous bleeding without any splenic injury the. Also shows hemorrhage into pancreatic cyst. CT scan of the head and C-spine were negative for any acute injury. -Patient given FFP and vitamin K and his INR has reversed.. -Given recurrent falls will d/c coumadin completely. -Abdominal pain has improved on current pain medications. Hemoglobin dropped slightly from his baseline of 10-11. -Hold aspirin. -Will order PT   Acute kidney injury Possibly secondary to dehydration and slowly improving with IV fluids. Hypokalemia  replenish   Leukocytosis Possibly reactive/stress  induced. No signs of infection. improving  Iron deficiency anemia Baseline hemoglobin of 10-11. Continue iron supplements  Coronary artery disease  Hold aspirin. Continue lopid   Uncontrolled hypertension Amlodipine held due to low blood pressure. Resume once stable  Diabetes mellitus  on sliding scale insulin.  GERD Continue PPI  BPH Continue Flomax.   Recent left ankle  fracture Non tender on exam and has good ROM. X-ray unremarkable    DVT prophylaxis: SCDs  Diet: Diabetic  Code Status: DO NOT RESUSCITATE Family Communication: None at bedside. Has a step son who is POA. Unable to reach on the number  available in the system  Disposition Plan: Return to skilled nursing facility possibly in next 24-48 hours in hemoglobin and renal function stable   Consultants:  None  Procedures:  None  Antibiotics:  None  HPI/Subjective: Patient seen and examined this morning. Reports his abdominal pain to be better.frustrated that he cannot eat properly as he doesnot have his dentures.  Objective: Filed Vitals:   10/13/13 0931  BP: 127/48  Pulse: 88  Temp:   Resp:     Intake/Output Summary (Last 24 hours) at 10/13/13 1416 Last data filed at 10/13/13 0826  Gross per 24 hour  Intake    910 ml  Output   1000 ml  Net    -90 ml   Filed Weights   10/11/13 0500 10/12/13 0035 10/13/13 0549  Weight: 67.4 kg (148 lb 9.4 oz) 68.1 kg (150 lb 2.1 oz) 67.586 kg (149 lb)    Exam:   General:  Elderly pleasant male in no acute distress  HEENT:  moist oral mucosa  Cardiovascular: S1 and S2 irregular, no murmurs rub or gallop  Respiratory: Clear breath sounds bilaterally  Abdomen: Soft, nondistended, bowel sounds present, nontender  Musculoskeletal: : No edema, Multiple areas of bluise over b/l arms and rt leg  CNS: Alert and oriented  Data Reviewed: Basic Metabolic Panel:  Recent Labs Lab 10/10/13 2135 10/11/13 0958 10/12/13 0341 10/13/13 0815  NA 141 143 144 141  K 3.8 3.5* 3.5* 3.9  CL 101 102  109 106  CO2 GLUCOSE 146* 159* 112* 185*  BUN 53* 45* 36* 25*  CREATININE 2.23* 1.92* 1.81* 1.62*  CALCIUM 10.0 9.2 8.9 8.9  MG  --  2.1  --   --   PHOS  --  2.7  --   --    Liver Function Tests:  Recent Labs Lab 10/10/13 2135 10/11/13 0958  AST 15 14  ALT 8 7  ALKPHOS 100 83  BILITOT 0.3 0.5  PROT 7.8 7.2   ALBUMIN 3.2* 3.1*   No results found for this basename: LIPASE, AMYLASE,  in the last 168 hours No results found for this basename: AMMONIA,  in the last 168 hours CBC:  Recent Labs Lab 10/10/13 2135 10/11/13 0958 11/07/2013 0341 10/13/13 0815  WBC 20.0* 14.4* 12.4* 13.7*  NEUTROABS 17.1*  --   --   --   HGB 12.2* 9.8* 8.7* 9.1*  HCT 37.2* 29.7* 26.7* 27.7*  MCV 90.7 90.5 90.8 92.6  PLT 332 199 204 238   Cardiac Enzymes: No results found for this basename: CKTOTAL, CKMB, CKMBINDEX, TROPONINI,  in the last 168 hours BNP (last 3 results) No results found for this basename: PROBNP,  in the last 8760 hours CBG:  Recent Labs Lab 11-07-2013 1214 07-Nov-2013 1815 11-07-2013 2152 10/13/13 0707 10/13/13 1145  GLUCAP 155* 163* 93 118* 132*    Recent Results (from the past 240 hour(s))  MRSA PCR SCREENING     Status: Abnormal   Collection Time    10/11/13  1:59 AM      Result Value Ref Range Status   MRSA by PCR POSITIVE (*) NEGATIVE Final   Comment:            The GeneXpert MRSA Assay (FDA     approved for NASAL specimens     only), is one component of a     comprehensive MRSA colonization     surveillance program. It is not     intended to diagnose MRSA     infection nor to guide or     monitor treatment for     MRSA infections.     RESULT CALLED TO, READ BACK BY AND VERIFIED WITH:     B.DAVIS,RN AT 0413 0N 10/11/13 BY SHEAW     Studies: Dg Ankle 2 Views Right  Nov 07, 2013   CLINICAL DATA:  Fall  EXAM: RIGHT ANKLE - 2 VIEW  COMPARISON:  None.  FINDINGS: There is chronic appearing bony overgrowth involving the medial malleolus. There is a bony prominence projecting over the lower medial ankle mortise as well as spurring laterally. These findings may represent prior trauma with healed deformity. No definite acute fracture. Soft tissue calcification posterior to the lower tibia also has a chronic appearance. Moderate spurring at the posterior calcaneus at the insertion of the  Achilles.  IMPRESSION: No acute bony pathology.  Chronic changes.   Electronically Signed   By: Maryclare Bean M.D.   On: November 07, 2013 11:13    Scheduled Meds: . sodium chloride   Intravenous Once  . Chlorhexidine Gluconate Cloth  6 each Topical Q0600  . docusate sodium  100 mg Oral BID  . ferrous sulfate  325 mg Oral QPC breakfast  . gemfibrozil  600 mg Oral BID AC  . insulin aspart  0-9 Units Subcutaneous TID AC & HS  . labetalol  100 mg Oral BID  . mupirocin ointment  1 application Nasal BID  . pantoprazole  40  mg Oral BID AC  . PARoxetine  10 mg Oral Daily  . tamsulosin  0.4 mg Oral Daily   Continuous Infusions: . sodium chloride 50 mL/hr at 10/11/13 1610       Time spent: 25 minutes    Eddie North  [facility] Pager [phone] If 7PM-7AM, please contact night-coverage at [email or web address], password [facility] 10/13/2013, 2:16 PM  LOS: 3 days

## 2013-10-13 NOTE — Progress Notes (Signed)
Clinical Social Work Department BRIEF PSYCHOSOCIAL ASSESSMENT 10/13/2013  Patient:  Clifford Spencer, Clifford Spencer     Account Number:  [id number]     Admit date:  10/10/2013  Clinical Social Worker:  Orpah Greek  Date/Time:  10/13/2013 02:32 PM  Referred by:  Physician  Date Referred:  10/13/2013 Referred for  Other - See comment   Other Referral:   Admitted from: [facility]   Interview type:  Family Other interview type:   patient's son, Clifford Spencer via phone (c#: [phone])    PSYCHOSOCIAL DATA Living Status:  FACILITY Admitted from facility:  [facility] Level of care:  Skilled Nursing Facility Primary support name:  Clifford Spencer (son/HCPOA) c#: [phone] Primary support relationship to patient:  CHILD, ADULT Degree of support available:   good    CURRENT CONCERNS Current Concerns  Post-Acute Placement   Other Concerns:    SOCIAL WORK ASSESSMENT / PLAN CSW received consult that patient was admitted from [facility].   Assessment/plan status:  Information/Referral to [facility] Other assessment/ plan:   Information/referral to community resources:   CSW completed FL2 and faxed information to [facility] SNF - confirmed with Efraim Kaufmann that they would be able to take patient back.    PATIENT'S/FAMILY'S RESPONSE TO PLAN OF CARE: Patient's son confirmed that patient was admitted from [facility] and plans to return to SNF at discharge. Patient's son was concerned about patient having had a fall at the SNF 3 weeks ago resulting in ankle fracture and recent fall a few days prior to hospitalization.       Lincoln Maxin, LCSW [facility] Clinical Social Worker cell #: [phone]

## 2013-10-13 NOTE — Evaluation (Signed)
Physical Therapy Evaluation Patient Details Name: Clifford Spencer MRN: [id number] DOB: [birth year] Today's Date: 10/13/2013   History of Present Illness  80 Y/O male admitted 10/11/13 after fall  at SNF, found to have intra abdominal hematoma and INR supratherapeutic.  Clinical Impression  Pt ambulated  In hall and c/o feeling weak. Recliner brought up. Pt will benefit from PT to address problems listed in chart.   Follow Up Recommendations SNF    Equipment Recommendations  None recommended by PT    Recommendations for Other Services       Precautions / Restrictions Precautions Precautions: Fall      Mobility  Bed Mobility Overal bed mobility: Needs Assistance Bed Mobility: Supine to Sit     Supine to sit: Supervision     General bed mobility comments: extra time to get to edge of bed.  Transfers Overall transfer level: Needs assistance Equipment used: Rolling walker (2 wheeled) Transfers: Sit to/from Stand Sit to Stand: Min assist         General transfer comment: cues for safety.  Ambulation/Gait Ambulation/Gait assistance: +2 safety/equipment;Min assist Ambulation Distance (Feet): 100 Feet Assistive device: Rolling walker (2 wheeled) Gait Pattern/deviations: Step-to pattern;Step-through pattern     General Gait Details: cues for guiding walker. pt  all of a sudden stated, "I don't think I can make it", recliner brought up.  Stairs            Wheelchair Mobility    Modified Rankin (Stroke Patients Only)       Balance Overall balance assessment: Needs assistance;History of Falls Sitting-balance support: Feet supported;No upper extremity supported Sitting balance-Leahy Scale: Fair     Standing balance support: During functional activity;Bilateral upper extremity supported Standing balance-Leahy Scale: Poor                               Pertinent Vitals/Pain Pain Assessment: No/denies pain    Home Living Family/patient expects  to be discharged to:: Skilled nursing facility                      Prior Function Level of Independence: Needs assistance         Comments: patient reports that he  walks with a walker.     Hand Dominance        Extremity/Trunk Assessment   Upper Extremity Assessment: Generalized weakness           Lower Extremity Assessment: Generalized weakness      Cervical / Trunk Assessment: Kyphotic  Communication      Cognition Arousal/Alertness: Awake/alert Behavior During Therapy: WFL for tasks assessed/performed Overall Cognitive Status: Within Functional Limits for tasks assessed                      General Comments      Exercises        Assessment/Plan    PT Assessment Patient needs continued PT services  PT Diagnosis Difficulty walking;Generalized weakness   PT Problem List Decreased strength;Decreased activity tolerance;Decreased balance;Decreased mobility;Decreased knowledge of precautions;Decreased safety awareness;Decreased knowledge of use of DME  PT Treatment Interventions DME instruction;Gait training;Functional mobility training;Therapeutic activities;Therapeutic exercise;Patient/family education   PT Goals (Current goals can be found in the Care Plan section) Acute Rehab PT Goals Patient Stated Goal: to go back to my home to see the pretty girls. PT Goal Formulation: With patient Time For Goal Achievement: 10/27/13 Potential to Achieve  Goals: Good    Frequency Min 2X/week   Barriers to discharge        Co-evaluation               End of Session Equipment Utilized During Treatment: Gait belt Activity Tolerance: Patient limited by fatigue Patient left: in chair;with call bell/phone within reach;with chair alarm set Nurse Communication: Mobility status         Time: 5284-1324 PT Time Calculation (min): 21 min   Charges:   PT Evaluation $Initial PT Evaluation Tier I: 1 Procedure PT Treatments $Gait Training:  8-22 mins   PT G Codes:          Rada Hay 10/13/2013, 3:26 PM Blanchard Kelch PT [phone]

## 2013-10-14 LAB — CBC
HCT: 24.6 % — ABNORMAL LOW (ref 39.0–52.0)
Hemoglobin: 7.9 g/dL — ABNORMAL LOW (ref 13.0–17.0)
MCH: 29.3 pg (ref 26.0–34.0)
MCHC: 32.1 g/dL (ref 30.0–36.0)
MCV: 91.1 fL (ref 78.0–100.0)
PLATELETS: 231 10*3/uL (ref 150–400)
RBC: 2.7 MIL/uL — ABNORMAL LOW (ref 4.22–5.81)
RDW: 16 % — AB (ref 11.5–15.5)
WBC: 11.7 10*3/uL — AB (ref 4.0–10.5)

## 2013-10-14 LAB — GLUCOSE, CAPILLARY
GLUCOSE-CAPILLARY: 142 mg/dL — AB (ref 70–99)
Glucose-Capillary: 106 mg/dL — ABNORMAL HIGH (ref 70–99)
Glucose-Capillary: 125 mg/dL — ABNORMAL HIGH (ref 70–99)
Glucose-Capillary: 123 mg/dL — ABNORMAL HIGH (ref 70–99)

## 2013-10-14 LAB — BASIC METABOLIC PANEL
Anion gap: 10 (ref 5–15)
BUN: 18 mg/dL (ref 6–23)
CHLORIDE: 110 meq/L (ref 96–112)
CO2: 22 mEq/L (ref 19–32)
Calcium: 8.6 mg/dL (ref 8.4–10.5)
Creatinine, Ser: 1.54 mg/dL — ABNORMAL HIGH (ref 0.50–1.35)
GFR calc Af Amer: 45 mL/min — ABNORMAL LOW (ref >90)
GFR calc non Af Amer: 39 mL/min — ABNORMAL LOW (ref >90)
Glucose, Bld: 111 mg/dL — ABNORMAL HIGH (ref 70–99)
POTASSIUM: 3.1 meq/L — AB (ref 3.7–5.3)
SODIUM: 142 meq/L (ref 137–147)

## 2013-10-14 MED ORDER — TRAMADOL HCL 50 MG PO TABS: 50.0000 mg | ORAL_TABLET | Freq: Four times a day (QID) | ORAL | Status: DC | PRN

## 2013-10-14 NOTE — Progress Notes (Signed)
PROGRESS NOTE  Clifford Spencer ZOX:[id number] DOB: 05/27/1925 DOA: 10/10/2013 PCP: No primary provider on file.  Assessment/Plan: intra-abdominal hematoma  Reportedly patient sustained a fall 2 days prior to admission and was having some abdominal pain since then.  -CT scan with left upper quadrant hematoma likely venous bleeding without any splenic injury the. Also shows hemorrhage into pancreatic cyst. CT scan of the head and C-spine were negative for any acute injury.  -Patient given FFP and vitamin K and his INR has reversed..  -Given recurrent falls will d/c coumadin completely.  -Abdominal pain has improved on current pain medications. Hemoglobin dropped slightly from his baseline of 10-11.  -Hold aspirin.  -Will order PT-->SNF Acute kidney injury  Possibly secondary to dehydration and slowly improving with IV fluids.  -Am BMP Hypokalemia  replenish  -Check magnesium Leukocytosis  Possibly reactive/stress induced. No signs of infection. improving  Iron deficiency anemia  Baseline hemoglobin of 10-11. Continue iron supplements  Coronary artery disease  Hold aspirin. Continue lopid  Uncontrolled hypertension  Amlodipine held due to low blood pressure. Resume once stable  Diabetes mellitus  on sliding scale insulin.  -Given the patient's age and renal failure--do not plan to restart metformin at time of discharge -Check hemoglobin A1c GERD  Continue PPI  BPH  Continue Flomax.  Recent left ankle fracture  Non tender on exam and has good ROM. X-ray unremarkable     Family Communication:   Pt at beside Disposition Plan:  SNFwhen medically stable   Procedures/Studies: Ct Abdomen Pelvis Wo Contrast  10/10/2013   CLINICAL DATA:  Larey Seat.  Left upper quadrant pain.  EXAM: CT ABDOMEN AND PELVIS WITHOUT CONTRAST  TECHNIQUE: Multidetector CT imaging of the abdomen and pelvis was performed following the standard protocol without IV contrast.  COMPARISON:  CT scan  02/26/2011  FINDINGS: The lung bases demonstrate dependent atelectasis and scarring. No pleural effusion. A calcified granuloma is noted at the left lung base. The heart is within normal limits in size. Coronary artery calcifications are noted. There is a moderate-sized hiatal hernia.  The large pancreatic cyst demonstrates evidence of interval hemorrhage with high attenuation material within the cyst measuring up to 70 Hounsfield units. This also hematoma/ hemorrhage in the left upper quadrant adjacent to the stomach and anterior to the spleen. I do not see a definite splenic rupture but recommend close followup.  The liver is unremarkable and stable. The gallbladder appears normal. No common bile duct dilatation. Moderate atrophy of the pancreatic head. The left adrenal gland demonstrates a small nodule which is likely a benign adenoma. The kidneys demonstrate numerous cysts. Right renal calculi are noted.  The stomach, duodenum, small bowel and colon are grossly normal. No inflammatory changes, mass lesions or obstructive findings. No mesenteric or retroperitoneal mass or adenopathy. Stable atherosclerotic calcifications involving the aorta and branch vessels.  CT pelvis: The prostate gland is mildly enlarged with median lobe hypertrophy impressing on the base of the bladder. The bladder is normal. No pelvic mass, adenopathy or hematoma. No inguinal mass or adenopathy.  The bony structures are intact. Advanced osteoporosis and degenerative changes in the spine.  IMPRESSION: 1. Left upper quadrant hematoma likely due to a small bleeding omental or mesenteric vein. This is adjacent to the spleen but I do not see a definite splenic injury. Recommend close followup. There is also evidence of hemorrhage into the pancreatic cyst. 2. Multiple stable renal cysts and right renal calculi.  3. No definite left-sided rib fractures. 4. Stable prostate gland enlargement.   Electronically Signed   By: Loralie Champagne M.D.   On:  10/10/2013 23:14   Dg Ankle 2 Views Right  10/12/2013   CLINICAL DATA:  Fall  EXAM: RIGHT ANKLE - 2 VIEW  COMPARISON:  None.  FINDINGS: There is chronic appearing bony overgrowth involving the medial malleolus. There is a bony prominence projecting over the lower medial ankle mortise as well as spurring laterally. These findings may represent prior trauma with healed deformity. No definite acute fracture. Soft tissue calcification posterior to the lower tibia also has a chronic appearance. Moderate spurring at the posterior calcaneus at the insertion of the Achilles.  IMPRESSION: No acute bony pathology.  Chronic changes.   Electronically Signed   By: Maryclare Bean M.D.   On: 10/12/2013 11:13   Ct Head Wo Contrast  10/10/2013   CLINICAL DATA:  Rolled out of bed; concern for head or cervical spine injury.  EXAM: CT HEAD WITHOUT CONTRAST  CT CERVICAL SPINE WITHOUT CONTRAST  TECHNIQUE: Multidetector CT imaging of the head and cervical spine was performed following the standard protocol without intravenous contrast. Multiplanar CT image reconstructions of the cervical spine were also generated.  COMPARISON:  MRI of the brain performed 09/25/2013, CT of the head performed 11/13/2010, and CT of the cervical spine performed 09/19/2009  FINDINGS: CT HEAD FINDINGS  There is no evidence of acute infarction, mass lesion, or intra- or extra-axial hemorrhage on CT.  Prominence of the ventricles and sulci reflects mild to moderate cortical volume loss. Mild cerebellar atrophy is noted. Scattered periventricular and subcortical white matter change likely reflects small vessel ischemic microangiopathy.  The brainstem and fourth ventricle are within normal limits. The basal ganglia are unremarkable in appearance. The cerebral hemispheres demonstrate grossly normal gray-white differentiation. No mass effect or midline shift is seen.  There is no evidence of fracture; visualized osseous structures are unremarkable in appearance. The  orbits are within normal limits. The paranasal sinuses and mastoid air cells are well-aerated. No significant soft tissue abnormalities are seen.  CT CERVICAL SPINE FINDINGS  There is no evidence of fracture or subluxation. Mild vacuum phenomenon is noted along the cervical spine. Underlying facet disease is noted, more prominent on the right. Vertebral bodies demonstrate normal height and alignment. Intervertebral disc spaces are otherwise preserved. Prevertebral soft tissues are within normal limits.  The thyroid gland is unremarkable in appearance. The visualized lung apices are clear. Mild calcification is noted at the carotid bifurcations bilaterally.  IMPRESSION: 1. No evidence of traumatic intracranial injury or fracture. 2. No evidence of fracture or subluxation along the cervical spine. 3. Mild to moderate cortical volume loss and scattered small vessel ischemic microangiopathy. 4. Minimal degenerative change noted along the cervical spine. 5. Mild calcification at the carotid bifurcations bilaterally.   Electronically Signed   By: Roanna Raider M.D.   On: 10/10/2013 23:13   Ct Cervical Spine Wo Contrast  10/10/2013   CLINICAL DATA:  Rolled out of bed; concern for head or cervical spine injury.  EXAM: CT HEAD WITHOUT CONTRAST  CT CERVICAL SPINE WITHOUT CONTRAST  TECHNIQUE: Multidetector CT imaging of the head and cervical spine was performed following the standard protocol without intravenous contrast. Multiplanar CT image reconstructions of the cervical spine were also generated.  COMPARISON:  MRI of the brain performed 09/25/2013, CT of the head performed 11/13/2010, and CT of the cervical spine performed 09/19/2009  FINDINGS: CT HEAD FINDINGS  There is no evidence of acute infarction, mass lesion, or intra- or extra-axial hemorrhage on CT.  Prominence of the ventricles and sulci reflects mild to moderate cortical volume loss. Mild cerebellar atrophy is noted. Scattered periventricular and subcortical  white matter change likely reflects small vessel ischemic microangiopathy.  The brainstem and fourth ventricle are within normal limits. The basal ganglia are unremarkable in appearance. The cerebral hemispheres demonstrate grossly normal gray-white differentiation. No mass effect or midline shift is seen.  There is no evidence of fracture; visualized osseous structures are unremarkable in appearance. The orbits are within normal limits. The paranasal sinuses and mastoid air cells are well-aerated. No significant soft tissue abnormalities are seen.  CT CERVICAL SPINE FINDINGS  There is no evidence of fracture or subluxation. Mild vacuum phenomenon is noted along the cervical spine. Underlying facet disease is noted, more prominent on the right. Vertebral bodies demonstrate normal height and alignment. Intervertebral disc spaces are otherwise preserved. Prevertebral soft tissues are within normal limits.  The thyroid gland is unremarkable in appearance. The visualized lung apices are clear. Mild calcification is noted at the carotid bifurcations bilaterally.  IMPRESSION: 1. No evidence of traumatic intracranial injury or fracture. 2. No evidence of fracture or subluxation along the cervical spine. 3. Mild to moderate cortical volume loss and scattered small vessel ischemic microangiopathy. 4. Minimal degenerative change noted along the cervical spine. 5. Mild calcification at the carotid bifurcations bilaterally.   Electronically Signed   By: Roanna Raider M.D.   On: 10/10/2013 23:13         Subjective:   Objective: Filed Vitals:   10/13/13 2055 10/14/13 0448 10/14/13 0453 10/14/13 1306  BP: 138/62 142/63  138/66  Pulse: 81 70  67  Temp: 98.3 F (36.8 C) 98.7 F (37.1 C)  98.5 F (36.9 C)  TempSrc: Oral Oral  Oral  Resp: Height:      Weight:   68.2 kg (150 lb 5.7 oz)   SpO2: 97% 95%  96%    Intake/Output Summary (Last 24 hours) at 10/14/13 1921 Last data filed at 10/14/13  1700  Gross per 24 hour  Intake   1200 ml  Output    850 ml  Net    350 ml   Weight change: 0.614 kg (1 lb 5.7 oz) Exam:   General:  Pt is alert, follows commands appropriately, not in acute distress  HEENT: No icterus, No thrush, No neck mass, Bryant/AT  Cardiovascular: RRR, S1/S2, no rubs, no gallops  Respiratory: CTA bilaterally, no wheezing, no crackles, no rhonchi  Abdomen: Soft/+BS, non tender, non distended, no guarding  Extremities: No edema, No lymphangitis, No petechiae, No rashes, no synovitis  Data Reviewed: Basic Metabolic Panel:  Recent Labs Lab 10/10/13 2135 10/11/13 0958 10/12/13 0341 10/13/13 0815 10/14/13 0510  NA 141 143 144 141 142  K 3.8 3.5* 3.5* 3.9 3.1*  CL 101 102 109 106 110  CO2 GLUCOSE 146* 159* 112* 185* 111*  BUN 53* 45* 36* 25* 18  CREATININE 2.23* 1.92* 1.81* 1.62* 1.54*  CALCIUM 10.0 9.2 8.9 8.9 8.6  MG  --  2.1  --   --   --   PHOS  --  2.7  --   --   --    Liver Function Tests:  Recent Labs Lab 10/10/13 2135 10/11/13 0958  AST 15 14  ALT 8 7  ALKPHOS 100 83  BILITOT 0.3 0.5  PROT 7.8 7.2  ALBUMIN 3.2* 3.1*   No results found for this basename: LIPASE, AMYLASE,  in the last 168 hours No results found for this basename: AMMONIA,  in the last 168 hours CBC:  Recent Labs Lab 10/10/13 2135 10/11/13 0958 10/12/13 0341 10/13/13 0815 10/14/13 0510  WBC 20.0* 14.4* 12.4* 13.7* 11.7*  NEUTROABS 17.1*  --   --   --   --   HGB 12.2* 9.8* 8.7* 9.1* 7.9*  HCT 37.2* 29.7* 26.7* 27.7* 24.6*  MCV 90.7 90.5 90.8 92.6 91.1  PLT 332 199 204 238 231   Cardiac Enzymes: No results found for this basename: CKTOTAL, CKMB, CKMBINDEX, TROPONINI,  in the last 168 hours BNP: No components found with this basename: POCBNP,  CBG:  Recent Labs Lab 10/13/13 1639 10/13/13 2217 10/14/13 0734 10/14/13 1210 10/14/13 1731  GLUCAP 164* 123* 106* 125* 123*    Recent Results (from the past 240 hour(s))  MRSA PCR  SCREENING     Status: Abnormal   Collection Time    10/11/13  1:59 AM      Result Value Ref Range Status   MRSA by PCR POSITIVE (*) NEGATIVE Final   Comment:            The GeneXpert MRSA Assay (FDA     approved for NASAL specimens     only), is one component of a     comprehensive MRSA colonization     surveillance program. It is not     intended to diagnose MRSA     infection nor to guide or     monitor treatment for     MRSA infections.     RESULT CALLED TO, READ BACK BY AND VERIFIED WITH:     B.DAVIS,RN AT 0413 0N 10/11/13 BY SHEAW     Scheduled Meds: . sodium chloride   Intravenous Once  . Chlorhexidine Gluconate Cloth  6 each Topical Q0600  . docusate sodium  100 mg Oral BID  . ferrous sulfate  325 mg Oral QPC breakfast  . gemfibrozil  600 mg Oral BID AC  . insulin aspart  0-9 Units Subcutaneous TID AC & HS  . labetalol  100 mg Oral BID  . mupirocin ointment  1 application Nasal BID  . pantoprazole  40 mg Oral BID AC  . PARoxetine  10 mg Oral Daily  . tamsulosin  0.4 mg Oral Daily   Continuous Infusions: . sodium chloride 50 mL/hr at 10/14/13 1033     Majesty Oehlert, DO  [facility] Pager [phone]  If 7PM-7AM, please contact night-coverage [email or web address] Password [email or web address] 10/14/2013, 7:21 PM   LOS: 4 days

## 2013-10-14 NOTE — Care Management Note (Addendum)
    Page 1 of 1   10/15/2013     11:43:48 AM CARE MANAGEMENT NOTE 10/15/2013  Patient:  Clifford Spencer, Clifford Spencer   Account Number:  [id number]  Date Initiated:  10/14/2013  Documentation initiated by:  Lanier Clam  Subjective/Objective Assessment:   80 Y/O M ADMITTED W/INTRA ABDOMINAL HEMATOMA.     Action/Plan:   FROM SNF.   Anticipated DC Date:  10/15/2013   Anticipated DC Plan:  SKILLED NURSING FACILITY      DC Planning Services  CM consult      Choice offered to / List presented to:             Status of service:  Completed, signed off Medicare Important Message given?  YES (If response is "NO", the following Medicare IM given date fields will be blank) Date Medicare IM given:  10/13/2013 Medicare IM given by:  [facility] Date Additional Medicare IM given:  10/15/2013 Additional Medicare IM given by:  [facility]  Discharge Disposition:  SKILLED NURSING FACILITY  Per UR Regulation:  Reviewed for med. necessity/level of care/duration of stay  If discussed at Long Length of Stay Meetings, dates discussed:   10/15/2013    Comments:  10/14/13 Naome Brigandi RN,BSN NCM [phone] D/C PLAN SNF.

## 2013-10-15 ENCOUNTER — Encounter (HOSPITAL_COMMUNITY): Payer: Self-pay | Admitting: Emergency Medicine

## 2013-10-15 ENCOUNTER — Emergency Department (HOSPITAL_COMMUNITY)
Admission: EM | Admit: 2013-10-15 | Discharge: 2013-10-16 | Disposition: A | Discharge status: Home / Self Care | Payer: Medicare Other | Attending: Emergency Medicine | Admitting: Emergency Medicine

## 2013-10-15 DIAGNOSIS — Z7982 Long term (current) use of aspirin: Secondary | ICD-10-CM | POA: Insufficient documentation

## 2013-10-15 DIAGNOSIS — F411 Generalized anxiety disorder: Secondary | ICD-10-CM | POA: Insufficient documentation

## 2013-10-15 DIAGNOSIS — R319 Hematuria, unspecified: Secondary | ICD-10-CM | POA: Diagnosis not present

## 2013-10-15 DIAGNOSIS — I10 Essential (primary) hypertension: Secondary | ICD-10-CM | POA: Insufficient documentation

## 2013-10-15 DIAGNOSIS — I2510 Atherosclerotic heart disease of native coronary artery without angina pectoris: Secondary | ICD-10-CM | POA: Insufficient documentation

## 2013-10-15 DIAGNOSIS — K219 Gastro-esophageal reflux disease without esophagitis: Secondary | ICD-10-CM | POA: Insufficient documentation

## 2013-10-15 DIAGNOSIS — E785 Hyperlipidemia, unspecified: Secondary | ICD-10-CM | POA: Insufficient documentation

## 2013-10-15 DIAGNOSIS — M129 Arthropathy, unspecified: Secondary | ICD-10-CM | POA: Insufficient documentation

## 2013-10-15 DIAGNOSIS — F0390 Unspecified dementia without behavioral disturbance: Secondary | ICD-10-CM | POA: Insufficient documentation

## 2013-10-15 DIAGNOSIS — Z87440 Personal history of urinary (tract) infections: Secondary | ICD-10-CM | POA: Insufficient documentation

## 2013-10-15 DIAGNOSIS — Z951 Presence of aortocoronary bypass graft: Secondary | ICD-10-CM | POA: Insufficient documentation

## 2013-10-15 DIAGNOSIS — Z79899 Other long term (current) drug therapy: Secondary | ICD-10-CM | POA: Insufficient documentation

## 2013-10-15 LAB — BASIC METABOLIC PANEL
Anion gap: 11 (ref 5–15)
Anion gap: 12 (ref 5–15)
BUN: 13 mg/dL (ref 6–23)
BUN: 13 mg/dL (ref 6–23)
CALCIUM: 8.8 mg/dL (ref 8.4–10.5)
CHLORIDE: 105 meq/L (ref 96–112)
CO2: 23 mEq/L (ref 19–32)
CO2: 22 mEq/L (ref 19–32)
CREATININE: 1.41 mg/dL — AB (ref 0.50–1.35)
Calcium: 8.5 mg/dL (ref 8.4–10.5)
Chloride: 105 mEq/L (ref 96–112)
Creatinine, Ser: 1.43 mg/dL — ABNORMAL HIGH (ref 0.50–1.35)
GFR calc non Af Amer: 43 mL/min — ABNORMAL LOW (ref >90)
GFR, EST AFRICAN AMERICAN: 50 mL/min — AB (ref >90)
GFR, EST AFRICAN AMERICAN: 50 mL/min — AB (ref >90)
GFR, EST NON AFRICAN AMERICAN: 44 mL/min — AB (ref >90)
GLUCOSE: 112 mg/dL — AB (ref 70–99)
Glucose, Bld: 128 mg/dL — ABNORMAL HIGH (ref 70–99)
Potassium: 3.4 mEq/L — ABNORMAL LOW (ref 3.7–5.3)
Potassium: 3.9 mEq/L (ref 3.7–5.3)
Sodium: 139 mEq/L (ref 137–147)
Sodium: 139 mEq/L (ref 137–147)

## 2013-10-15 LAB — CBC WITH DIFFERENTIAL/PLATELET
BASOS ABS: 0 10*3/uL (ref 0.0–0.1)
Basophils Relative: 0 % (ref 0–1)
EOS PCT: 4 % (ref 0–5)
Eosinophils Absolute: 0.4 10*3/uL (ref 0.0–0.7)
HCT: 26 % — ABNORMAL LOW (ref 39.0–52.0)
Hemoglobin: 8.6 g/dL — ABNORMAL LOW (ref 13.0–17.0)
LYMPHS ABS: 1.4 10*3/uL (ref 0.7–4.0)
LYMPHS PCT: 14 % (ref 12–46)
MCH: 29.3 pg (ref 26.0–34.0)
MCHC: 33.1 g/dL (ref 30.0–36.0)
MCV: 88.4 fL (ref 78.0–100.0)
Monocytes Absolute: 0.8 10*3/uL (ref 0.1–1.0)
Monocytes Relative: 8 % (ref 3–12)
NEUTROS ABS: 7.4 10*3/uL (ref 1.7–7.7)
NEUTROS PCT: 74 % (ref 43–77)
PLATELETS: 302 10*3/uL (ref 150–400)
RBC: 2.94 MIL/uL — AB (ref 4.22–5.81)
RDW: 15.5 % (ref 11.5–15.5)
WBC: 10 10*3/uL (ref 4.0–10.5)

## 2013-10-15 LAB — CBC
HCT: 24.6 % — ABNORMAL LOW (ref 39.0–52.0)
Hemoglobin: 8.1 g/dL — ABNORMAL LOW (ref 13.0–17.0)
MCH: 29.9 pg (ref 26.0–34.0)
MCHC: 32.9 g/dL (ref 30.0–36.0)
MCV: 90.8 fL (ref 78.0–100.0)
Platelets: 250 10*3/uL (ref 150–400)
RBC: 2.71 MIL/uL — ABNORMAL LOW (ref 4.22–5.81)
RDW: 15.5 % (ref 11.5–15.5)
WBC: 12.6 10*3/uL — AB (ref 4.0–10.5)

## 2013-10-15 LAB — URINALYSIS, ROUTINE W REFLEX MICROSCOPIC
BILIRUBIN URINE: NEGATIVE
Glucose, UA: NEGATIVE mg/dL
Ketones, ur: NEGATIVE mg/dL
Leukocytes, UA: NEGATIVE
Nitrite: NEGATIVE
PROTEIN: 30 mg/dL — AB
Specific Gravity, Urine: 1.011 (ref 1.005–1.030)
UROBILINOGEN UA: 1 mg/dL (ref 0.0–1.0)
pH: 6 (ref 5.0–8.0)

## 2013-10-15 LAB — URINE MICROSCOPIC-ADD ON

## 2013-10-15 LAB — HEMOGLOBIN A1C
Hgb A1c MFr Bld: 6.4 % — ABNORMAL HIGH (ref <5.7)
Mean Plasma Glucose: 137 mg/dL — ABNORMAL HIGH (ref <117)

## 2013-10-15 LAB — GLUCOSE, CAPILLARY
GLUCOSE-CAPILLARY: 159 mg/dL — AB (ref 70–99)
Glucose-Capillary: 107 mg/dL — ABNORMAL HIGH (ref 70–99)

## 2013-10-15 MED ORDER — POTASSIUM CHLORIDE CRYS ER 20 MEQ PO TBCR
20.0000 meq | EXTENDED_RELEASE_TABLET | Freq: Once | ORAL | Status: AC
  Administered 2013-10-15: 20 meq via ORAL
  Filled 2013-10-15: qty 1

## 2013-10-15 MED ORDER — ASPIRIN 81 MG PO TABS: 81.0000 mg | ORAL_TABLET | Freq: Every day | ORAL | Status: DC

## 2013-10-15 MED ORDER — TRAMADOL HCL 50 MG PO TABS: 50.0000 mg | ORAL_TABLET | Freq: Four times a day (QID) | ORAL | Status: DC | PRN

## 2013-10-15 NOTE — ED Notes (Signed)
Bed: FA21 Expected date:  Expected time:  Means of arrival:  Comments: EMS 80 yo male hematuria-just released today/dementia

## 2013-10-15 NOTE — Progress Notes (Signed)
Patient D/C to [facility], pt is medically stable. Report given to Lattie Haw, RN at [facility]. Patient has no complaint. Patient transported via [facility]

## 2013-10-15 NOTE — Discharge Summary (Addendum)
Physician Discharge Summary  Clifford Spencer ZOX:[id number] DOB: [birth year] DOA: 10/10/2013  PCP: No primary provider on file.  Admit date: 10/10/2013 Discharge date: 10/15/2013  Recommendations for Outpatient Follow-up:  1. Pt will need to follow up with PCP in 2 weeks post discharge 2. Please obtain BMP 10/19/13 3. Please also check CBC 10/19/13  Discharge Diagnoses:  intra-abdominal hematoma  Reportedly patient sustained a fall 2 days prior to admission and was having some abdominal pain since then.  -CT scan with left upper quadrant hematoma likely venous bleeding without any splenic injury the. Also shows hemorrhage into pancreatic cyst. CT scan of the head and C-spine were negative for any acute injury.  -Patient given FFP and vitamin K and his INR has reversed..  -Given recurrent falls will d/c coumadin completely.  -Abdominal pain has improved on current pain medications. Hemoglobin dropped slightly from his baseline of 10-11.  -Since then, hemoglobin has remained stable--hemoglobin 8.1 on the day of discharge -Hold aspirin for 2 additional weeks--would not restart aspirin  daily until 10/29/13 which is 2 weeks from today's discharge date -Recheck hemoglobin/CBC on 10/19/2013 -Will order PT-->SNF  Acute kidney injury  Possibly secondary to dehydration and slowly improving with IV fluids.  -Am BMP--serum creatinine continues to improve  -Baseline creatinine 1.2-1.4  -Serum creatinine on the day of discharge 1.43 -Recheck BMP on 10/19/2013  Hypokalemia  replenished Leukocytosis  Possibly reactive/stress induced. No signs of infection. Improving/stable -Remains afebrile hemodynamically stable Iron deficiency anemia  Baseline hemoglobin of 10-11. Continue iron supplements  Coronary artery disease  Hold aspirin. Continue lopid  -As discussed above, we'll hold aspirin until 10/29/2013  Uncontrolled hypertension  -Labetalol had been restarted -Restart home dose of  amlodipine as blood pressure has gradually increased  Diabetes mellitus  on sliding scale insulin.  -Given the patient's age and renal failure--do not plan to restart metformin at time of discharge  -Check hemoglobin A1c--pending at time of d/c -Given the patient's age and glycemic control during the hospitalization, I will not restart any oral hypoglycemic agents at this time after discharge  -The patient's CBG should continue to be checked before each meal  -Please followup on hemoglobin A1c results before making any further decisions regarding restarting oral hypoglycemic agents  GERD  Continue PPI  BPH  Continue Flomax.  Recent left ankle fracture  Non tender on exam and has good ROM. X-ray unremarkable Severe malnutrition -Patient was seen by nutritionist and supplementation was provided  Discharge Condition: Stable   Disposition: Skilled nursing facility   Diet:carbohydrate modified Wt Readings from Last 3 Encounters:  10/15/13 69 kg (152 lb 1.9 oz)  06/27/12 70.534 kg (155 lb 8 oz)    History of present illness:   80 year old male with history of coronary artery disease, A. fib on Coumadin, BPH, dementia, GERD, hypertension resident of skilled nursing facility was brought to the ED after he had a fall while going to the bathroom. He also reported having left-sided abdominal pain  2 days prior to admission and also had an episode of vomiting on the day of admission.  Patient was found to have left upper quadrant intra-abdominal hematoma on CT scan with supratherapeutic INR of 9.4. General surgery was consulted who recommended bedrest and close monitoring and patient was admitted to step down under hospitalist service. The patient remained hemodynamically stable. He was subsequently transferred to the medical floor where he remained clinically stable. The patient was given vitamin K and FFP with improvement of his INR. The  patient's hemoglobin initially dropped, but remained stable  thereafter. The patient's blood pressure gradually increased throughout the hospitalization. As a result, the patient's labetalol and amlodipine were restarted. The patient's diet was advanced and he tolerated. He was noted to be in acute renal failure with a serum creatinine of 2.23. This was likely due to hypovolemia. The patient was fluid resuscitated. His serum creatinine improved and was 1.43 on the day of discharge.   Consultants: General surgery from ED  Discharge Exam: Filed Vitals:   10/15/13 0544  BP: 148/65  Pulse: 62  Temp: 98.3 F (36.8 C)  Resp: 18   Filed Vitals:   10/14/13 0453 10/14/13 1306 10/14/13 2151 10/15/13 0544  BP:  138/66 139/60 148/65  Pulse:  67 72 62  Temp:  98.5 F (36.9 C) 99.5 F (37.5 C) 98.3 F (36.8 C)  TempSrc:  Oral Oral Oral  Resp:  18 18 18   Height:      Weight: 68.2 kg (150 lb 5.7 oz)   69 kg (152 lb 1.9 oz)  SpO2:  96% 95% 97%   General: Awake and alert, NAD, pleasant, cooperative Cardiovascular: IRRR, no rub,  Respiratory: CTAB, no wheeze, no rhonchi Abdomen:soft, nontender, nondistended, positive bowel sounds Extremities: No edema, No lymphangitis, no petechiae  Discharge Instructions      Discharge Instructions   Diet - low sodium heart healthy    Complete by:  As directed      Increase activity slowly    Complete by:  As directed             Medication List    STOP taking these medications       metFORMIN 850 MG tablet  Commonly known as:  GLUCOPHAGE     warfarin 3 MG tablet  Commonly known as:  COUMADIN      TAKE these medications       acetaminophen 325 MG tablet  Commonly known as:  TYLENOL  Take 650 mg by mouth every 4 (four) hours as needed for mild pain.     amLODipine 2.5 MG tablet  Commonly known as:  NORVASC  Take 2.5 mg by mouth daily.     aspirin 81 MG tablet  Take 1 tablet (81 mg total) by mouth daily. Start 10/29/13  Start taking on:  10/29/2013     calcium carbonate 500 MG chewable  tablet  Commonly known as:  TUMS - dosed in mg elemental calcium  Chew 1 tablet by mouth every 4 (four) hours as needed for indigestion or heartburn.     CoQ10 200 MG Caps  Take 1 capsule by mouth at bedtime.     docusate sodium 100 MG capsule  Commonly known as:  COLACE  Take 100 mg by mouth 2 (two) times daily.     ferrous sulfate 325 (65 FE) MG tablet  Take 325 mg by mouth daily with breakfast.     gemfibrozil 600 MG tablet  Commonly known as:  LOPID  Take 600 mg by mouth 2 (two) times daily before a meal.     labetalol 100 MG tablet  Commonly known as:  NORMODYNE  Take 100 mg by mouth 2 (two) times daily.     lactobacillus acidophilus Tabs tablet  Take 2 tablets by mouth 2 (two) times daily.     magnesium oxide 400 MG tablet  Commonly known as:  MAG-OX  Take 400 mg by mouth 2 (two) times daily.     nitroGLYCERIN 0.4 MG SL  tablet  Commonly known as:  NITROSTAT  Place 0.4 mg under the tongue every 5 (five) minutes as needed for chest pain.     pantoprazole 20 MG tablet  Commonly known as:  PROTONIX  Take 10 mg by mouth daily.     PARoxetine 10 MG tablet  Commonly known as:  PAXIL  Take 10 mg by mouth daily.     promethazine 25 MG/ML injection  Commonly known as:  PHENERGAN  Inject 25 mg into the muscle every 6 (six) hours as needed for nausea or vomiting.     tamsulosin 0.4 MG Caps capsule  Commonly known as:  FLOMAX  Take 0.4 mg by mouth daily after breakfast.     traMADol 50 MG tablet  Commonly known as:  ULTRAM  Take 1 tablet (50 mg total) by mouth every 6 (six) hours as needed for moderate pain.     traMADol 50 MG tablet  Commonly known as:  ULTRAM  Take 50 mg by mouth 2 (two) times daily.     Vitamin D3 5000 UNITS Tabs  Take 1 tablet by mouth daily.         The results of significant diagnostics from this hospitalization (including imaging, microbiology, ancillary and laboratory) are listed below for reference.    Significant Diagnostic  Studies: Ct Abdomen Pelvis Wo Contrast  10/10/2013   CLINICAL DATA:  Larey Seat.  Left upper quadrant pain.  EXAM: CT ABDOMEN AND PELVIS WITHOUT CONTRAST  TECHNIQUE: Multidetector CT imaging of the abdomen and pelvis was performed following the standard protocol without IV contrast.  COMPARISON:  CT scan 02/26/2011  FINDINGS: The lung bases demonstrate dependent atelectasis and scarring. No pleural effusion. A calcified granuloma is noted at the left lung base. The heart is within normal limits in size. Coronary artery calcifications are noted. There is a moderate-sized hiatal hernia.  The large pancreatic cyst demonstrates evidence of interval hemorrhage with high attenuation material within the cyst measuring up to 70 Hounsfield units. This also hematoma/ hemorrhage in the left upper quadrant adjacent to the stomach and anterior to the spleen. I do not see a definite splenic rupture but recommend close followup.  The liver is unremarkable and stable. The gallbladder appears normal. No common bile duct dilatation. Moderate atrophy of the pancreatic head. The left adrenal gland demonstrates a small nodule which is likely a benign adenoma. The kidneys demonstrate numerous cysts. Right renal calculi are noted.  The stomach, duodenum, small bowel and colon are grossly normal. No inflammatory changes, mass lesions or obstructive findings. No mesenteric or retroperitoneal mass or adenopathy. Stable atherosclerotic calcifications involving the aorta and branch vessels.  CT pelvis: The prostate gland is mildly enlarged with median lobe hypertrophy impressing on the base of the bladder. The bladder is normal. No pelvic mass, adenopathy or hematoma. No inguinal mass or adenopathy.  The bony structures are intact. Advanced osteoporosis and degenerative changes in the spine.  IMPRESSION: 1. Left upper quadrant hematoma likely due to a small bleeding omental or mesenteric vein. This is adjacent to the spleen but I do not see a  definite splenic injury. Recommend close followup. There is also evidence of hemorrhage into the pancreatic cyst. 2. Multiple stable renal cysts and right renal calculi. 3. No definite left-sided rib fractures. 4. Stable prostate gland enlargement.   Electronically Signed   By: Loralie Champagne M.D.   On: 10/10/2013 23:14   Dg Ankle 2 Views Right  10/12/2013   CLINICAL DATA:  Fall  EXAM: RIGHT ANKLE - 2 VIEW  COMPARISON:  None.  FINDINGS: There is chronic appearing bony overgrowth involving the medial malleolus. There is a bony prominence projecting over the lower medial ankle mortise as well as spurring laterally. These findings may represent prior trauma with healed deformity. No definite acute fracture. Soft tissue calcification posterior to the lower tibia also has a chronic appearance. Moderate spurring at the posterior calcaneus at the insertion of the Achilles.  IMPRESSION: No acute bony pathology.  Chronic changes.   Electronically Signed   By: Maryclare Bean M.D.   On: 10/12/2013 11:13   Ct Head Wo Contrast  10/10/2013   CLINICAL DATA:  Rolled out of bed; concern for head or cervical spine injury.  EXAM: CT HEAD WITHOUT CONTRAST  CT CERVICAL SPINE WITHOUT CONTRAST  TECHNIQUE: Multidetector CT imaging of the head and cervical spine was performed following the standard protocol without intravenous contrast. Multiplanar CT image reconstructions of the cervical spine were also generated.  COMPARISON:  MRI of the brain performed 09/25/2013, CT of the head performed 11/13/2010, and CT of the cervical spine performed 09/19/2009  FINDINGS: CT HEAD FINDINGS  There is no evidence of acute infarction, mass lesion, or intra- or extra-axial hemorrhage on CT.  Prominence of the ventricles and sulci reflects mild to moderate cortical volume loss. Mild cerebellar atrophy is noted. Scattered periventricular and subcortical white matter change likely reflects small vessel ischemic microangiopathy.  The brainstem and fourth  ventricle are within normal limits. The basal ganglia are unremarkable in appearance. The cerebral hemispheres demonstrate grossly normal gray-white differentiation. No mass effect or midline shift is seen.  There is no evidence of fracture; visualized osseous structures are unremarkable in appearance. The orbits are within normal limits. The paranasal sinuses and mastoid air cells are well-aerated. No significant soft tissue abnormalities are seen.  CT CERVICAL SPINE FINDINGS  There is no evidence of fracture or subluxation. Mild vacuum phenomenon is noted along the cervical spine. Underlying facet disease is noted, more prominent on the right. Vertebral bodies demonstrate normal height and alignment. Intervertebral disc spaces are otherwise preserved. Prevertebral soft tissues are within normal limits.  The thyroid gland is unremarkable in appearance. The visualized lung apices are clear. Mild calcification is noted at the carotid bifurcations bilaterally.  IMPRESSION: 1. No evidence of traumatic intracranial injury or fracture. 2. No evidence of fracture or subluxation along the cervical spine. 3. Mild to moderate cortical volume loss and scattered small vessel ischemic microangiopathy. 4. Minimal degenerative change noted along the cervical spine. 5. Mild calcification at the carotid bifurcations bilaterally.   Electronically Signed   By: Roanna Raider M.D.   On: 10/10/2013 23:13   Ct Cervical Spine Wo Contrast  10/10/2013   CLINICAL DATA:  Rolled out of bed; concern for head or cervical spine injury.  EXAM: CT HEAD WITHOUT CONTRAST  CT CERVICAL SPINE WITHOUT CONTRAST  TECHNIQUE: Multidetector CT imaging of the head and cervical spine was performed following the standard protocol without intravenous contrast. Multiplanar CT image reconstructions of the cervical spine were also generated.  COMPARISON:  MRI of the brain performed 09/25/2013, CT of the head performed 11/13/2010, and CT of the cervical spine  performed 09/19/2009  FINDINGS: CT HEAD FINDINGS  There is no evidence of acute infarction, mass lesion, or intra- or extra-axial hemorrhage on CT.  Prominence of the ventricles and sulci reflects mild to moderate cortical volume loss. Mild cerebellar atrophy is noted. Scattered periventricular and subcortical white matter change  likely reflects small vessel ischemic microangiopathy.  The brainstem and fourth ventricle are within normal limits. The basal ganglia are unremarkable in appearance. The cerebral hemispheres demonstrate grossly normal gray-white differentiation. No mass effect or midline shift is seen.  There is no evidence of fracture; visualized osseous structures are unremarkable in appearance. The orbits are within normal limits. The paranasal sinuses and mastoid air cells are well-aerated. No significant soft tissue abnormalities are seen.  CT CERVICAL SPINE FINDINGS  There is no evidence of fracture or subluxation. Mild vacuum phenomenon is noted along the cervical spine. Underlying facet disease is noted, more prominent on the right. Vertebral bodies demonstrate normal height and alignment. Intervertebral disc spaces are otherwise preserved. Prevertebral soft tissues are within normal limits.  The thyroid gland is unremarkable in appearance. The visualized lung apices are clear. Mild calcification is noted at the carotid bifurcations bilaterally.  IMPRESSION: 1. No evidence of traumatic intracranial injury or fracture. 2. No evidence of fracture or subluxation along the cervical spine. 3. Mild to moderate cortical volume loss and scattered small vessel ischemic microangiopathy. 4. Minimal degenerative change noted along the cervical spine. 5. Mild calcification at the carotid bifurcations bilaterally.   Electronically Signed   By: Roanna Raider M.D.   On: 10/10/2013 23:13     Microbiology: Recent Results (from the past 240 hour(s))  MRSA PCR SCREENING     Status: Abnormal   Collection Time      10/11/13  1:59 AM      Result Value Ref Range Status   MRSA by PCR POSITIVE (*) NEGATIVE Final   Comment:            The GeneXpert MRSA Assay (FDA     approved for NASAL specimens     only), is one component of a     comprehensive MRSA colonization     surveillance program. It is not     intended to diagnose MRSA     infection nor to guide or     monitor treatment for     MRSA infections.     RESULT CALLED TO, READ BACK BY AND VERIFIED WITH:     B.DAVIS,RN AT 0413 0N 10/11/13 BY SHEAW     Labs: Basic Metabolic Panel:  Recent Labs Lab 10/11/13 0958 10/12/13 0341 10/13/13 0815 10/14/13 0510 10/15/13 0501  NA 143 144 141 142 139  K 3.5* 3.5* 3.9 3.1* 3.4*  CL 102 109 106 110 105  CO2 GLUCOSE 159* 112* 185* 111* 112*  BUN 45* 36* 25* 18 13  CREATININE 1.92* 1.81* 1.62* 1.54* 1.43*  CALCIUM 9.2 8.9 8.9 8.6 8.5  MG 2.1  --   --   --   --   PHOS 2.7  --   --   --   --    Liver Function Tests:  Recent Labs Lab 10/10/13 2135 10/11/13 0958  AST 15 14  ALT 8 7  ALKPHOS 100 83  BILITOT 0.3 0.5  PROT 7.8 7.2  ALBUMIN 3.2* 3.1*   No results found for this basename: LIPASE, AMYLASE,  in the last 168 hours No results found for this basename: AMMONIA,  in the last 168 hours CBC:  Recent Labs Lab 10/10/13 2135 10/11/13 0958 10/12/13 0341 10/13/13 0815 10/14/13 0510 10/15/13 0501  WBC 20.0* 14.4* 12.4* 13.7* 11.7* 12.6*  NEUTROABS 17.1*  --   --   --   --   --   HGB 12.2*  9.8* 8.7* 9.1* 7.9* 8.1*  HCT 37.2* 29.7* 26.7* 27.7* 24.6* 24.6*  MCV 90.7 90.5 90.8 92.6 91.1 90.8  PLT 332 199 204 238 231 250   Cardiac Enzymes: No results found for this basename: CKTOTAL, CKMB, CKMBINDEX, TROPONINI,  in the last 168 hours BNP: No components found with this basename: POCBNP,  CBG:  Recent Labs Lab 10/14/13 0734 10/14/13 1210 10/14/13 1731 10/14/13 2156 10/15/13 0752  GLUCAP 106* 125* 123* 142* 107*    Time coordinating discharge:  Greater  than 30 minutes  Signed:  Orlin Kann, DO [facility] Pager: [phone] 10/15/2013, 2:06 PM

## 2013-10-15 NOTE — ED Notes (Signed)
Patient c/o LLQ pain, and hematuria. Patient states hematuria started @ 1500 today. Patient given urinal by NT and requested to provide a urine sample. Patient unable to describe LLQ pain, "it hurts".

## 2013-10-15 NOTE — Progress Notes (Signed)
Patient is set to discharge back to [facility] today. Patient & son, Ramon Dredge (ph#: [phone]) aware. [employer] authorization obtained. Discharge packet given to RN, Ene. [facility] called for transport.   Lincoln Maxin, LCSW [facility] Clinical Social Worker cell #: [phone]

## 2013-10-15 NOTE — ED Notes (Signed)
Per EMS, patient from [facility] c/o hematuria. Patient recently seen here for fall and was admitted for acute renal failure. Patient states he was complaining of LLQ pain past two days, no tenderness upon palpation. Patient with baseline dementia.

## 2013-10-16 LAB — PROTIME-INR
INR: 1.8 — AB (ref 0.00–1.49)
Prothrombin Time: 20.9 seconds — ABNORMAL HIGH (ref 11.6–15.2)

## 2013-10-16 NOTE — Discharge Instructions (Signed)

## 2013-10-16 NOTE — ED Notes (Signed)
Per Lab, PT/INR has not been received. Attempted to draw, collection unsuccessful. ED phlebotomy requested to assist with collection.

## 2013-10-16 NOTE — ED Provider Notes (Signed)
Medical screening examination/treatment/procedure(s) were conducted as a shared visit with non-physician practitioner(s) and myself.  I personally evaluated the patient during the encounter.   EKG Interpretation None      Pt presents with hematuria. Hb is stable, INR is not supratherapeutic. The abd exam is benign, and non peritoneal. Hx of hematuria per records. Will give Urology f/u.  Derwood Kaplan, MD 10/16/13 [phone]

## 2013-10-16 NOTE — ED Notes (Signed)
[facility] called for patient transport 

## 2013-10-16 NOTE — ED Provider Notes (Signed)
CSN: [id number]     Arrival date & time 10/15/13  2133 History   First MD Initiated Contact with Patient 10/15/13 2209     Chief Complaint  Patient presents with  . Hematuria     (Consider location/radiation/quality/duration/timing/severity/associated sxs/prior Treatment) HPI Comments: Patient with dementia, history of hematuria, presents to the emergency department with chief complaint of hematuria. He was seen here earlier this week after complaining of fall. He had extensive imaging done. He was recently admitted for renal failure. States that this morning he saw some blood in his urine. States it is still sore from when he fell. Denies any fevers, chills, nausea, vomiting, diarrhea, or constipation. There are no activating or relieving factors.  The history is provided by the patient. No language interpreter was used.    Past Medical History  Diagnosis Date  . Coronary artery disease   . UTI (lower urinary tract infection)   . GERD (gastroesophageal reflux disease)   . Arthritis   . Dementia   . Hypertension   . BPH (benign prostatic hyperplasia)   . Anxiety   . Sciatica   . Hyperlipidemia   . Renal calculi   . Urinary frequency   . Urge incontinence   . DJD (degenerative joint disease), lumbar   . Microscopic hematuria   . CAD (coronary artery disease)   . Urethral bleeding   . A-fib    Past Surgical History  Procedure Laterality Date  . Back surgery    . Cardiac surgery    . Coronary artery bypass graft    . Prostate biopsy     Family History  Problem Relation Age of Onset  . Breast cancer Mother    History  Substance Use Topics  . Smoking status: Never Smoker   . Smokeless tobacco: Not on file  . Alcohol Use: No    Review of Systems  All other systems reviewed and are negative.     Allergies  Review of patient's allergies indicates no known allergies.  Home Medications   Prior to Admission medications   Medication Sig Start Date End Date  Taking? Authorizing Provider  aspirin 81 MG tablet Take 1 tablet (81 mg total) by mouth daily. Start 10/29/13 10/29/13  Yes Catarina Hartshorn, MD  Cholecalciferol (VITAMIN D3) 5000 UNITS TABS Take 1 tablet by mouth daily.   Yes Historical Provider, MD  Coenzyme Q10 (COQ10) 200 MG CAPS Take 1 capsule by mouth at bedtime.   Yes Historical Provider, MD  docusate sodium (COLACE) 100 MG capsule Take 100 mg by mouth 2 (two) times daily.   Yes Historical Provider, MD  ferrous sulfate 325 (65 FE) MG tablet Take 325 mg by mouth daily with breakfast.   Yes Historical Provider, MD  gemfibrozil (LOPID) 600 MG tablet Take 600 mg by mouth 2 (two) times daily before a meal.   Yes Historical Provider, MD  labetalol (NORMODYNE) 100 MG tablet Take 100 mg by mouth 2 (two) times daily.   Yes Historical Provider, MD  lactobacillus acidophilus (BACID) TABS tablet Take 2 tablets by mouth 2 (two) times daily.   Yes Historical Provider, MD  magnesium oxide (MAG-OX) 400 MG tablet Take 400 mg by mouth 2 (two) times daily.   Yes Historical Provider, MD  nitroGLYCERIN (NITROSTAT) 0.4 MG SL tablet Place 0.4 mg under the tongue every 5 (five) minutes as needed for chest pain.   Yes Historical Provider, MD  pantoprazole (PROTONIX) 20 MG tablet Take 20 mg by mouth daily.  Yes Historical Provider, MD  PARoxetine (PAXIL) 10 MG tablet Take 10 mg by mouth daily.   Yes Historical Provider, MD  promethazine (PHENERGAN) 25 MG/ML injection Inject 25 mg into the muscle every 6 (six) hours as needed for nausea or vomiting.   Yes Historical Provider, MD  tamsulosin (FLOMAX) 0.4 MG CAPS Take 0.4 mg by mouth daily after breakfast.   Yes Historical Provider, MD  traMADol (ULTRAM) 50 MG tablet Take 50 mg by mouth 2 (two) times daily.   Yes Historical Provider, MD  traMADol (ULTRAM) 50 MG tablet Take 1 tablet (50 mg total) by mouth every 6 (six) hours as needed for moderate pain. 10/15/13  Yes Catarina Hartshorn, MD  traMADol (ULTRAM) 50 MG tablet Take 50 mg by  mouth every 6 (six) hours as needed for moderate pain.   Yes Historical Provider, MD   BP 136/58  Pulse 66  Temp(Src) 98.6 F (37 C) (Oral)  Resp 14  SpO2 96% Physical Exam  Nursing note and vitals reviewed. Constitutional: He is oriented to person, place, and time. He appears well-developed and well-nourished.  HENT:  Head: Normocephalic and atraumatic.  Eyes: Conjunctivae and EOM are normal. Pupils are equal, round, and reactive to light. Right eye exhibits no discharge. Left eye exhibits no discharge. No scleral icterus.  Neck: Normal range of motion. Neck supple. No JVD present.  Cardiovascular: Normal rate, regular rhythm and normal heart sounds.  Exam reveals no gallop and no friction rub.   No murmur heard. Pulmonary/Chest: Effort normal and breath sounds normal. No respiratory distress. He has no wheezes. He has no rales. He exhibits no tenderness.  Abdominal: Soft. He exhibits no distension and no mass. There is no tenderness. There is no rebound and no guarding.  No focal abdominal tenderness, no RLQ tenderness or pain at McBurney's point, no RUQ tenderness or Murphy's sign, no left-sided abdominal tenderness, no fluid wave, or signs of peritonitis   Musculoskeletal: Normal range of motion. He exhibits no edema and no tenderness.  Neurological: He is alert and oriented to person, place, and time.  Skin: Skin is warm and dry.  Psychiatric: He has a normal mood and affect. His behavior is normal. Judgment and thought content normal.    ED Course  Procedures (including critical care time) Results for orders placed during the hospital encounter of 10/15/13  CBC WITH DIFFERENTIAL      Result Value Ref Range   WBC 10.0  4.0 - 10.5 K/uL   RBC 2.94 (*) 4.22 - 5.81 MIL/uL   Hemoglobin 8.6 (*) 13.0 - 17.0 g/dL   HCT 16.1 (*) 09.6 - 04.5 %   MCV 88.4  78.0 - 100.0 fL   MCH 29.3  26.0 - 34.0 pg   MCHC 33.1  30.0 - 36.0 g/dL   RDW 40.9  81.1 - 91.4 %   Platelets 302  150 - 400  K/uL   Neutrophils Relative % 74  43 - 77 %   Neutro Abs 7.4  1.7 - 7.7 K/uL   Lymphocytes Relative 14  12 - 46 %   Lymphs Abs 1.4  0.7 - 4.0 K/uL   Monocytes Relative 8  3 - 12 %   Monocytes Absolute 0.8  0.1 - 1.0 K/uL   Eosinophils Relative 4  0 - 5 %   Eosinophils Absolute 0.4  0.0 - 0.7 K/uL   Basophils Relative 0  0 - 1 %   Basophils Absolute 0.0  0.0 - 0.1 K/uL  BASIC METABOLIC PANEL      Result Value Ref Range   Sodium 139  137 - 147 mEq/L   Potassium 3.9  3.7 - 5.3 mEq/L   Chloride 105  96 - 112 mEq/L   CO2 22  19 - 32 mEq/L   Glucose, Bld 128 (*) 70 - 99 mg/dL   BUN 13  6 - 23 mg/dL   Creatinine, Ser 4.09 (*) 0.50 - 1.35 mg/dL   Calcium 8.8  8.4 - 81.1 mg/dL   GFR calc non Af Amer 44 (*) >90 mL/min   GFR calc Af Amer 50 (*) >90 mL/min   Anion gap 12  5 - 15  URINALYSIS, ROUTINE W REFLEX MICROSCOPIC      Result Value Ref Range   Color, Urine YELLOW  YELLOW   APPearance CLOUDY (*) CLEAR   Specific Gravity, Urine 1.011  1.005 - 1.030   pH 6.0  5.0 - 8.0   Glucose, UA NEGATIVE  NEGATIVE mg/dL   Hgb urine dipstick LARGE (*) NEGATIVE   Bilirubin Urine NEGATIVE  NEGATIVE   Ketones, ur NEGATIVE  NEGATIVE mg/dL   Protein, ur 30 (*) NEGATIVE mg/dL   Urobilinogen, UA 1.0  0.0 - 1.0 mg/dL   Nitrite NEGATIVE  NEGATIVE   Leukocytes, UA NEGATIVE  NEGATIVE  PROTIME-INR      Result Value Ref Range   Prothrombin Time 20.9 (*) 11.6 - 15.2 seconds   INR 1.80 (*) 0.00 - 1.49  URINE MICROSCOPIC-ADD ON      Result Value Ref Range   RBC / HPF TOO NUMEROUS TO COUNT  <3 RBC/hpf   Ct Abdomen Pelvis Wo Contrast  10/10/2013   CLINICAL DATA:  Larey Seat.  Left upper quadrant pain.  EXAM: CT ABDOMEN AND PELVIS WITHOUT CONTRAST  TECHNIQUE: Multidetector CT imaging of the abdomen and pelvis was performed following the standard protocol without IV contrast.  COMPARISON:  CT scan 02/26/2011  FINDINGS: The lung bases demonstrate dependent atelectasis and scarring. No pleural effusion. A calcified  granuloma is noted at the left lung base. The heart is within normal limits in size. Coronary artery calcifications are noted. There is a moderate-sized hiatal hernia.  The large pancreatic cyst demonstrates evidence of interval hemorrhage with high attenuation material within the cyst measuring up to 70 Hounsfield units. This also hematoma/ hemorrhage in the left upper quadrant adjacent to the stomach and anterior to the spleen. I do not see a definite splenic rupture but recommend close followup.  The liver is unremarkable and stable. The gallbladder appears normal. No common bile duct dilatation. Moderate atrophy of the pancreatic head. The left adrenal gland demonstrates a small nodule which is likely a benign adenoma. The kidneys demonstrate numerous cysts. Right renal calculi are noted.  The stomach, duodenum, small bowel and colon are grossly normal. No inflammatory changes, mass lesions or obstructive findings. No mesenteric or retroperitoneal mass or adenopathy. Stable atherosclerotic calcifications involving the aorta and branch vessels.  CT pelvis: The prostate gland is mildly enlarged with median lobe hypertrophy impressing on the base of the bladder. The bladder is normal. No pelvic mass, adenopathy or hematoma. No inguinal mass or adenopathy.  The bony structures are intact. Advanced osteoporosis and degenerative changes in the spine.  IMPRESSION: 1. Left upper quadrant hematoma likely due to a small bleeding omental or mesenteric vein. This is adjacent to the spleen but I do not see a definite splenic injury. Recommend close followup. There is also evidence of hemorrhage into the  pancreatic cyst. 2. Multiple stable renal cysts and right renal calculi. 3. No definite left-sided rib fractures. 4. Stable prostate gland enlargement.   Electronically Signed   By: Loralie Champagne M.D.   On: 10/10/2013 23:14   Dg Ankle 2 Views Right  10/12/2013   CLINICAL DATA:  Fall  EXAM: RIGHT ANKLE - 2 VIEW   COMPARISON:  None.  FINDINGS: There is chronic appearing bony overgrowth involving the medial malleolus. There is a bony prominence projecting over the lower medial ankle mortise as well as spurring laterally. These findings may represent prior trauma with healed deformity. No definite acute fracture. Soft tissue calcification posterior to the lower tibia also has a chronic appearance. Moderate spurring at the posterior calcaneus at the insertion of the Achilles.  IMPRESSION: No acute bony pathology.  Chronic changes.   Electronically Signed   By: Maryclare Bean M.D.   On: 10/12/2013 11:13   Ct Head Wo Contrast  10/10/2013   CLINICAL DATA:  Rolled out of bed; concern for head or cervical spine injury.  EXAM: CT HEAD WITHOUT CONTRAST  CT CERVICAL SPINE WITHOUT CONTRAST  TECHNIQUE: Multidetector CT imaging of the head and cervical spine was performed following the standard protocol without intravenous contrast. Multiplanar CT image reconstructions of the cervical spine were also generated.  COMPARISON:  MRI of the brain performed 09/25/2013, CT of the head performed 11/13/2010, and CT of the cervical spine performed 09/19/2009  FINDINGS: CT HEAD FINDINGS  There is no evidence of acute infarction, mass lesion, or intra- or extra-axial hemorrhage on CT.  Prominence of the ventricles and sulci reflects mild to moderate cortical volume loss. Mild cerebellar atrophy is noted. Scattered periventricular and subcortical white matter change likely reflects small vessel ischemic microangiopathy.  The brainstem and fourth ventricle are within normal limits. The basal ganglia are unremarkable in appearance. The cerebral hemispheres demonstrate grossly normal gray-white differentiation. No mass effect or midline shift is seen.  There is no evidence of fracture; visualized osseous structures are unremarkable in appearance. The orbits are within normal limits. The paranasal sinuses and mastoid air cells are well-aerated. No  significant soft tissue abnormalities are seen.  CT CERVICAL SPINE FINDINGS  There is no evidence of fracture or subluxation. Mild vacuum phenomenon is noted along the cervical spine. Underlying facet disease is noted, more prominent on the right. Vertebral bodies demonstrate normal height and alignment. Intervertebral disc spaces are otherwise preserved. Prevertebral soft tissues are within normal limits.  The thyroid gland is unremarkable in appearance. The visualized lung apices are clear. Mild calcification is noted at the carotid bifurcations bilaterally.  IMPRESSION: 1. No evidence of traumatic intracranial injury or fracture. 2. No evidence of fracture or subluxation along the cervical spine. 3. Mild to moderate cortical volume loss and scattered small vessel ischemic microangiopathy. 4. Minimal degenerative change noted along the cervical spine. 5. Mild calcification at the carotid bifurcations bilaterally.   Electronically Signed   By: Roanna Raider M.D.   On: 10/10/2013 23:13   Ct Cervical Spine Wo Contrast  10/10/2013   CLINICAL DATA:  Rolled out of bed; concern for head or cervical spine injury.  EXAM: CT HEAD WITHOUT CONTRAST  CT CERVICAL SPINE WITHOUT CONTRAST  TECHNIQUE: Multidetector CT imaging of the head and cervical spine was performed following the standard protocol without intravenous contrast. Multiplanar CT image reconstructions of the cervical spine were also generated.  COMPARISON:  MRI of the brain performed 09/25/2013, CT of the head performed 11/13/2010, and CT  of the cervical spine performed 09/19/2009  FINDINGS: CT HEAD FINDINGS  There is no evidence of acute infarction, mass lesion, or intra- or extra-axial hemorrhage on CT.  Prominence of the ventricles and sulci reflects mild to moderate cortical volume loss. Mild cerebellar atrophy is noted. Scattered periventricular and subcortical white matter change likely reflects small vessel ischemic microangiopathy.  The brainstem and  fourth ventricle are within normal limits. The basal ganglia are unremarkable in appearance. The cerebral hemispheres demonstrate grossly normal gray-white differentiation. No mass effect or midline shift is seen.  There is no evidence of fracture; visualized osseous structures are unremarkable in appearance. The orbits are within normal limits. The paranasal sinuses and mastoid air cells are well-aerated. No significant soft tissue abnormalities are seen.  CT CERVICAL SPINE FINDINGS  There is no evidence of fracture or subluxation. Mild vacuum phenomenon is noted along the cervical spine. Underlying facet disease is noted, more prominent on the right. Vertebral bodies demonstrate normal height and alignment. Intervertebral disc spaces are otherwise preserved. Prevertebral soft tissues are within normal limits.  The thyroid gland is unremarkable in appearance. The visualized lung apices are clear. Mild calcification is noted at the carotid bifurcations bilaterally.  IMPRESSION: 1. No evidence of traumatic intracranial injury or fracture. 2. No evidence of fracture or subluxation along the cervical spine. 3. Mild to moderate cortical volume loss and scattered small vessel ischemic microangiopathy. 4. Minimal degenerative change noted along the cervical spine. 5. Mild calcification at the carotid bifurcations bilaterally.   Electronically Signed   By: Roanna Raider M.D.   On: 10/10/2013 23:13      EKG Interpretation None      MDM   Final diagnoses:  Hematuria    Patient with hematuria. His hemoglobin is stable. No gross blood in urine. I looked at this personally.  He has had hematuria for quite some time. Still a little sore from his fall, but no sign of acute abdomen or other process.  Discussed patient with Dr. Rhunette Croft, who agrees that the patient can be discharged to home.  He will need to follow-up with a urologist.      Roxy Horseman, PA-C 10/16/13 0143  Roxy Horseman,  PA-C 10/16/13 1610

## 2013-10-19 LAB — URINE CULTURE

## 2013-10-20 ENCOUNTER — Telehealth (HOSPITAL_BASED_OUTPATIENT_CLINIC_OR_DEPARTMENT_OTHER): Payer: Self-pay | Admitting: Emergency Medicine

## 2013-10-20 NOTE — Telephone Encounter (Signed)
Post ED Visit - Positive Culture Follow-up  Culture report reviewed by antimicrobial stewardship pharmacist:  Wes Dulaney, Pharm.D., BCPS  Celedonio Miyamoto, [address].D., BCPS  Georgina Pillion, Pharm.D., BCPS  [place], [address].D., BCPS, AAHIVP  Estella Husk, Pharm.D., BCPS, AAHIVP  Carly Sabat, Pharm.D.  Enzo Bi, [address].D.  Positive urine culture >100,000 colonies/ml Klebsiella  Treated with none,  no further patient follow-up is required at this time. Results faxed to [facility] [phone] Berle Mull 10/20/2013, 12:25 PM

## 2017-10-17 ENCOUNTER — Other Ambulatory Visit: Payer: Self-pay

## 2017-10-17 ENCOUNTER — Encounter (HOSPITAL_BASED_OUTPATIENT_CLINIC_OR_DEPARTMENT_OTHER): Payer: Self-pay | Admitting: Emergency Medicine

## 2017-10-17 ENCOUNTER — Emergency Department (HOSPITAL_BASED_OUTPATIENT_CLINIC_OR_DEPARTMENT_OTHER)
Admission: EM | Admit: 2017-10-17 | Discharge: 2017-10-17 | Disposition: A | Discharge status: Skilled Nursing Facility | Payer: Medicare Other | Attending: Emergency Medicine | Admitting: Emergency Medicine

## 2017-10-17 DIAGNOSIS — Z7982 Long term (current) use of aspirin: Secondary | ICD-10-CM | POA: Diagnosis not present

## 2017-10-17 DIAGNOSIS — F0390 Unspecified dementia without behavioral disturbance: Secondary | ICD-10-CM | POA: Diagnosis not present

## 2017-10-17 DIAGNOSIS — Y92121 Bathroom in nursing home as the place of occurrence of the external cause: Secondary | ICD-10-CM | POA: Insufficient documentation

## 2017-10-17 DIAGNOSIS — Y999 Unspecified external cause status: Secondary | ICD-10-CM | POA: Insufficient documentation

## 2017-10-17 DIAGNOSIS — Y92129 Unspecified place in nursing home as the place of occurrence of the external cause: Secondary | ICD-10-CM

## 2017-10-17 DIAGNOSIS — S51012A Laceration without foreign body of left elbow, initial encounter: Secondary | ICD-10-CM | POA: Diagnosis not present

## 2017-10-17 DIAGNOSIS — Y93E8 Activity, other personal hygiene: Secondary | ICD-10-CM | POA: Insufficient documentation

## 2017-10-17 DIAGNOSIS — I2510 Atherosclerotic heart disease of native coronary artery without angina pectoris: Secondary | ICD-10-CM | POA: Diagnosis not present

## 2017-10-17 DIAGNOSIS — W19XXXA Unspecified fall, initial encounter: Secondary | ICD-10-CM | POA: Insufficient documentation

## 2017-10-17 DIAGNOSIS — I10 Essential (primary) hypertension: Secondary | ICD-10-CM | POA: Diagnosis not present

## 2017-10-17 NOTE — ED Triage Notes (Signed)
Pt brought to ED by [facility] from nursing facility. Pt states he missed the toilet when he went to sit down. Pt denies any pain. Pt has skin tear to left elbow.

## 2017-10-17 NOTE — ED Notes (Signed)
[facility] @ [phone]

## 2017-10-17 NOTE — ED Provider Notes (Signed)
[facility] Provider Note: Lowella DellJ. Lane Vernadette Stutsman, MD, FACEP  CSN: [id number] MRN: [id number] ARRIVAL: 10/17/17 at 0308 ROOM: MH01/[facility]01   CHIEF COMPLAINT  Fall  Level 5 caveat: Dementia HISTORY OF PRESENT ILLNESS  10/17/17 3:14 AM Clifford Spencer is a 84 y.o. male who fell at his rehab facility just prior to arrival.  He was attempting to sit on the toilet and "I missed".  He denies injury but EMS notes a skin tear to his left elbow.  He denies pain.  There was no loss of consciousness.  He is at his baseline mentally.  He is not on anticoagulation.   Past Medical History:  Diagnosis Date  . A-fib   . Anxiety   . Arthritis   . BPH (benign prostatic hyperplasia)   . CAD (coronary artery disease)   . Coronary artery disease   . Dementia   . DJD (degenerative joint disease), lumbar   . GERD (gastroesophageal reflux disease)   . Hyperlipidemia   . Hypertension   . Microscopic hematuria   . Renal calculi   . Sciatica   . Urethral bleeding   . Urge incontinence   . Urinary frequency   . UTI (lower urinary tract infection)     Past Surgical History:  Procedure Laterality Date  . BACK SURGERY    . CARDIAC SURGERY    . CORONARY ARTERY BYPASS GRAFT    . PROSTATE BIOPSY      Family History  Problem Relation Age of Onset  . Breast cancer Mother     Social History   Tobacco Use  . Smoking status: Never Smoker  Substance Use Topics  . Alcohol use: No  . Drug use: No    Prior to Admission medications   Medication Sig Start Date End Date Taking? Authorizing Provider  aspirin 81 MG tablet Take 1 tablet (81 mg total) by mouth daily. Start 10/29/13 10/29/13   Catarina Hartshornat, David, MD  Cholecalciferol (VITAMIN D3) 5000 UNITS TABS Take 1 tablet by mouth daily.    [provider]  Coenzyme Q10 (COQ10) 200 MG CAPS Take 1 capsule by mouth at bedtime.    [provider]  docusate sodium (COLACE) 100 MG capsule Take 100 mg by mouth 2 (two) times daily.    [provider]  ferrous sulfate 325 (65 FE) MG tablet Take 325 mg by mouth daily with breakfast.    [provider]  gemfibrozil (LOPID) 600 MG tablet Take 600 mg by mouth 2 (two) times daily before a meal.    [provider]  labetalol (NORMODYNE) 100 MG tablet Take 100 mg by mouth 2 (two) times daily.    [provider]  lactobacillus acidophilus (BACID) TABS tablet Take 2 tablets by mouth 2 (two) times daily.    [provider]  magnesium oxide (MAG-OX) 400 MG tablet Take 400 mg by mouth 2 (two) times daily.    [provider]  nitroGLYCERIN (NITROSTAT) 0.4 MG SL tablet Place 0.4 mg under the tongue every 5 (five) minutes as needed for chest pain.    [provider]  pantoprazole (PROTONIX) 20 MG tablet Take 20 mg by mouth daily.     [provider]  PARoxetine (PAXIL) 10 MG tablet Take 10 mg by mouth daily.    [provider]  promethazine (PHENERGAN) 25 MG/ML injection Inject 25 mg into the muscle every 6 (six) hours as needed for nausea or vomiting.    [provider]  tamsulosin (FLOMAX) 0.4 MG CAPS Take 0.4 mg by mouth daily after breakfast.    [provider]  traMADol (ULTRAM) 50 MG tablet Take 50 mg by mouth 2 (two) times daily.    [provider]  traMADol (ULTRAM) 50 MG tablet Take 1 tablet (50 mg total) by mouth every 6 (six) hours as needed for moderate pain. 10/15/13   Catarina Hartshorn, MD  traMADol (ULTRAM) 50 MG tablet Take 50 mg by mouth every 6 (six) hours as needed for moderate pain.    [provider]    Allergies Patient has no known allergies.   REVIEW OF SYSTEMS     PHYSICAL EXAMINATION  Initial Vital Signs Blood pressure 140/76, pulse 70, temperature 97.6 F (36.4 C), temperature source Oral, resp. rate 16, SpO2 95 %.  Examination General: Well-developed, well-nourished male in no acute distress; appearance consistent with age of record HENT: normocephalic;  atraumatic; edentulous Eyes: pupils equal, round and reactive to light; extraocular muscles intact; bilateral pseudophakia Neck: supple; nontender Heart: regular rate and rhythm Lungs: clear to auscultation bilaterally Abdomen: soft; nondistended; nontender; bowel sounds present Extremities: Arthritic changes; no pain on passive range of motion; pulses normal Neurologic: Awake, alert and oriented to person, place, month and POTUS, disoriented to year and day of week; motor function intact in all extremities and symmetric; no facial droop Skin: Warm and dry; superficial skin tear left elbow Psychiatric: Normal mood and affect   RESULTS  Summary of this visit's results, reviewed by myself:   EKG Interpretation  Date/Time:    Ventricular Rate:    PR Interval:    QRS Duration:   QT Interval:    QTC Calculation:   R Axis:     Text Interpretation:        Laboratory Studies: No results found for this or any previous visit (from the past 24 hour(s)). Imaging Studies: No results found.  ED COURSE and MDM  Nursing notes and initial vitals signs, including pulse oximetry, reviewed.  Vitals:   10/17/17 0311  BP: 140/76  Pulse: 70  Resp: 16  Temp: 97.6 F (36.4 C)  TempSrc: Oral  SpO2: 95%   No evidence of significant injury on exam.  Local wound care for the skin tear.  PROCEDURES    ED DIAGNOSES     ICD-10-CM   1. Fall at nursing home, initial encounter W19.XXXA    Y92.129   2. Skin tear of left elbow without complication, initial encounter S51.012A        Saul Fabiano, Jonny Ruiz, MD 10/17/17 [phone]

## 2017-10-17 NOTE — ED Notes (Signed)
[facility] called, pt awaiting transport to nsg facility. Pt given warm blankets and lights dimmed. Call bell in reach.

## 2017-11-29 DEATH — deceased
